# Patient Record
Sex: Male | Born: 1960 | Race: White | Hispanic: No | Marital: Married | Smoking: Former smoker
Health system: Southern US, Community
[De-identification: ages and names within clinical notes are randomized; demographics above are authoritative.]

## PROBLEM LIST (undated history)

## (undated) DIAGNOSIS — J301 Allergic rhinitis due to pollen: Secondary | ICD-10-CM

## (undated) DIAGNOSIS — K5790 Diverticulosis of intestine, part unspecified, without perforation or abscess without bleeding: Secondary | ICD-10-CM

## (undated) DIAGNOSIS — K219 Gastro-esophageal reflux disease without esophagitis: Secondary | ICD-10-CM

## (undated) DIAGNOSIS — T7840XA Allergy, unspecified, initial encounter: Secondary | ICD-10-CM

## (undated) DIAGNOSIS — E785 Hyperlipidemia, unspecified: Secondary | ICD-10-CM

## (undated) DIAGNOSIS — E119 Type 2 diabetes mellitus without complications: Secondary | ICD-10-CM

## (undated) DIAGNOSIS — I10 Essential (primary) hypertension: Secondary | ICD-10-CM

## (undated) HISTORY — PX: FRACTURE SURGERY: SHX138

## (undated) HISTORY — DX: Allergy, unspecified, initial encounter: T78.40XA

## (undated) HISTORY — DX: Gastro-esophageal reflux disease without esophagitis: K21.9

## (undated) HISTORY — PX: SCAPHOID FRACTURE SURGERY: SHX769

## (undated) HISTORY — PX: COLONOSCOPY: SHX174

## (undated) HISTORY — DX: Allergic rhinitis due to pollen: J30.1

## (undated) HISTORY — DX: Essential (primary) hypertension: I10

## (undated) HISTORY — DX: Diverticulosis of intestine, part unspecified, without perforation or abscess without bleeding: K57.90

## (undated) HISTORY — PX: KNEE SURGERY: SHX244

## (undated) HISTORY — DX: Type 2 diabetes mellitus without complications: E11.9

## (undated) HISTORY — DX: Hyperlipidemia, unspecified: E78.5

---

## 2001-03-08 HISTORY — PX: KNEE SURGERY: SHX244

## 2003-08-12 ENCOUNTER — Encounter: Admission: RE | Admit: 2003-08-12 | Discharge: 2003-08-12 | Payer: Self-pay | Admitting: Family Medicine

## 2006-03-08 HISTORY — PX: SCAPHOID FRACTURE SURGERY: SHX769

## 2012-11-01 ENCOUNTER — Encounter: Payer: Self-pay | Admitting: Family Medicine

## 2012-11-01 ENCOUNTER — Encounter: Payer: Self-pay | Admitting: Internal Medicine

## 2012-11-01 ENCOUNTER — Ambulatory Visit (INDEPENDENT_AMBULATORY_CARE_PROVIDER_SITE_OTHER): Payer: Medicare HMO | Admitting: Family Medicine

## 2012-11-01 VITALS — BP 112/80 | HR 73 | Temp 98.2°F | Ht 68.5 in | Wt 214.2 lb

## 2012-11-01 DIAGNOSIS — E785 Hyperlipidemia, unspecified: Secondary | ICD-10-CM

## 2012-11-01 DIAGNOSIS — Z125 Encounter for screening for malignant neoplasm of prostate: Secondary | ICD-10-CM

## 2012-11-01 DIAGNOSIS — Z0000 Encounter for general adult medical examination without abnormal findings: Secondary | ICD-10-CM

## 2012-11-01 DIAGNOSIS — Z1211 Encounter for screening for malignant neoplasm of colon: Secondary | ICD-10-CM

## 2012-11-01 DIAGNOSIS — Z79899 Other long term (current) drug therapy: Secondary | ICD-10-CM

## 2012-11-01 LAB — CBC WITH DIFFERENTIAL/PLATELET
Basophils Absolute: 0 10*3/uL (ref 0.0–0.1)
Basophils Relative: 0.4 % (ref 0.0–3.0)
Eosinophils Absolute: 0.2 10*3/uL (ref 0.0–0.7)
Eosinophils Relative: 1.5 % (ref 0.0–5.0)
Hemoglobin: 14 g/dL (ref 13.0–17.0)
Lymphs Abs: 1.9 10*3/uL (ref 0.7–4.0)
Monocytes Absolute: 0.6 10*3/uL (ref 0.1–1.0)
Monocytes Relative: 6.5 % (ref 3.0–12.0)
Neutro Abs: 7.2 10*3/uL (ref 1.4–7.7)
RBC: 4.72 Mil/uL (ref 4.22–5.81)
WBC: 10 10*3/uL (ref 4.5–10.5)

## 2012-11-01 NOTE — Progress Notes (Signed)
[facility] at [facility] [address] [place] [address] Phone: [phone] Fax: [phone]  Date:  11/01/2012   Name:  Willie Johnson   DOB:  1960-08-22   MRN:  [id number] Gender: male Age: 52 y.o.  Primary Physician:  Hannah Beat, MD  Evaluating MD: Hannah Beat, MD   Chief Complaint: New Patient   History of Present Illness:  Willie Johnson is a 52 y.o. pleasant patient who presents with the following:  New patient:  Colonoscopy: needs  Preventative Health Maintenance Visit:  Health Maintenance Summary Reviewed and updated, unless pt declines services.  Tobacco History Reviewed. Alcohol: No concerns, no excessive use Exercise Habits: Some activity, rec at least 30 mins 5 times a week - some now STD concerns: no risk or activity to increase risk Drug Use: None Encouraged self-testicular check  No health maintenance topics applied.  Labs reviewed with the patient.  No results found for this or any previous visit.   There are no active problems to display for this patient.   Past Medical History  Diagnosis Date  . Allergic rhinitis due to pollen   . Hyperlipidemia     Past Surgical History  Procedure Laterality Date  . Fracture surgery      History   Social History  . Marital Status: Single    Spouse Name: N/A    Number of Children: N/A  . Years of Education: N/A   Occupational History  . software    Social History Main Topics  . Smoking status: Former Smoker -- 20 years    Quit date: 04/08/2012  . Smokeless tobacco: Never Used  . Alcohol Use: No  . Drug Use: No  . Sexual Activity: Yes    Partners: Male   Other Topics Concern  . 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 Arthritis Mother   . Hyperlipidemia Mother   . Heart disease Mother   .  Hypertension Mother   . Diabetes Mother   . Hyperlipidemia Father   . Heart disease Father   . Stroke Father   . Pulmonary embolism Sister     No Known Allergies  No current outpatient prescriptions on file prior to visit.   No current facility-administered medications on file prior to visit.     Review of Systems:   General: Denies fever, chills, sweats. No significant weight loss. Eyes: Denies blurring,significant itching ENT: Denies earache, sore throat, and hoarseness. Cardiovascular: Denies chest pains, palpitations, dyspnea on exertion Respiratory: Denies cough, dyspnea at rest,wheeezing Breast: no concerns about lumps GI: Denies nausea, vomiting, diarrhea, constipation, change in bowel habits, abdominal pain, melena, hematochezia GU: Denies penile discharge, ED, urinary flow / outflow problems. No STD concerns. Musculoskeletal: Denies back pain, joint pain Derm: Denies rash, itching Neuro: Denies  paresthesias, frequent falls, frequent headaches Psych: Denies depression, anxiety Endocrine: Denies cold intolerance, heat intolerance, polydipsia Heme: Denies enlarged lymph nodes Allergy: No hayfever   Physical Examination: BP 112/80  Pulse 73  Temp(Src) 98.2 F (36.8 C) (Oral)  Ht 5' 8.5" (1.74 m)  Wt 214 lb 4 oz (97.183 kg)  BMI 32.1 kg/m2  Ideal Body Weight: Weight in (lb) to have BMI = 25: 166.5   Wt Readings from Last 3 Encounters:  11/01/12 214 lb 4  oz (97.183 kg)    GEN: well developed, well nourished, no acute distress Eyes: conjunctiva and lids normal, PERRLA, EOMI ENT: TM clear, nares clear, oral exam WNL Neck: supple, no lymphadenopathy, no thyromegaly, no JVD Pulm: clear to auscultation and percussion, respiratory effort normal CV: regular rate and rhythm, S1-S2, no murmur, rub or gallop, no bruits, peripheral pulses normal and symmetric, no cyanosis, clubbing, edema or varicosities Chest: no scars, masses GI: soft, non-tender; no  hepatosplenomegaly, masses; active bowel sounds all quadrants GU: no hernia, testicular mass, penile discharge, or prostate enlargement Lymph: no cervical, axillary or inguinal adenopathy MSK: gait normal, muscle tone and strength WNL, no joint swelling, effusions, discoloration, crepitus  SKIN: clear, good turgor, color WNL, no rashes, lesions, or ulcerations Neuro: normal mental status, normal strength, sensation, and motion Psych: alert; oriented to person, place and time, normally interactive and not anxious or depressed in appearance.  Assessment and Plan:  Routine general medical examination at a health care facility  Special screening for malignant neoplasm of prostate - Plan: PSA  Other and unspecified hyperlipidemia - Plan: Lipid panel  Encounter for long-term (current) use of other medications - Plan: Basic metabolic panel, CBC with Differential, Hepatic function panel  Special screening for malignant neoplasms, colon - Plan: Ambulatory referral to Gastroenterology  The patient's preventative maintenance and recommended screening tests for an annual wellness exam were reviewed in full today. Brought up to date unless services declined.  Counselled on the importance of diet, exercise, and its role in overall health and mortality. The patient's FH and SH was reviewed, including their home life, tobacco status, and drug and alcohol status.   Healthy new patient.  Keep up lipitor  Check labs colonoscopy  Orders Today:  Orders Placed This Encounter  Procedures  . Basic metabolic panel  . CBC with Differential  . Hepatic function panel  . Lipid panel  . PSA  . Ambulatory referral to Gastroenterology    Referral Priority:  Routine    Referral Type:  Consultation    Referral Reason:  Specialty Services Required    Requested Specialty:  Gastroenterology    Number of Visits Requested:  1    Updated Medication List: (Includes new medications, updates to list, dose  adjustments) Meds ordered this encounter  Medications  . atorvastatin (LIPITOR) 40 MG tablet    Sig: Take 40 mg by mouth daily.  Marland Kitchen aspirin 81 MG tablet    Sig: Take 81 mg by mouth daily.  . Multiple Vitamin (MULTIVITAMIN) tablet    Sig: Take 1 tablet by mouth daily.    Medications Discontinued: There are no discontinued medications.    Signed, Elpidio Galea. Dontrell Stuck, MD 11/01/2012 2:12 PM

## 2012-11-02 ENCOUNTER — Encounter: Payer: Self-pay | Admitting: Family Medicine

## 2012-11-02 LAB — LIPID PANEL
HDL: 36.1 mg/dL — ABNORMAL LOW (ref >39.00)
VLDL: 44 mg/dL — ABNORMAL HIGH (ref 0.0–40.0)

## 2012-11-02 LAB — HEPATIC FUNCTION PANEL
AST: 23 U/L (ref 0–37)
Albumin: 4.1 g/dL (ref 3.5–5.2)
Total Protein: 6.9 g/dL (ref 6.0–8.3)

## 2012-11-02 LAB — BASIC METABOLIC PANEL
BUN: 9 mg/dL (ref 6–23)
Calcium: 9.1 mg/dL (ref 8.4–10.5)
Chloride: 105 mEq/L (ref 96–112)
Creatinine, Ser: 0.9 mg/dL (ref 0.4–1.5)
Potassium: 3.8 mEq/L (ref 3.5–5.1)
Sodium: 139 mEq/L (ref 135–145)

## 2012-11-02 LAB — PSA: PSA: 0.75 ng/mL (ref 0.10–4.00)

## 2012-12-18 ENCOUNTER — Ambulatory Visit (AMBULATORY_SURGERY_CENTER): Payer: Managed Care, Other (non HMO)

## 2012-12-18 VITALS — Ht 69.0 in | Wt 205.0 lb

## 2012-12-18 DIAGNOSIS — Z1211 Encounter for screening for malignant neoplasm of colon: Secondary | ICD-10-CM

## 2012-12-18 MED ORDER — MOVIPREP 100 G PO SOLR: 1.0000 | Freq: Once | ORAL | Status: DC

## 2012-12-19 ENCOUNTER — Encounter: Payer: Self-pay | Admitting: Internal Medicine

## 2013-01-01 ENCOUNTER — Encounter: Payer: Medicare HMO | Admitting: Internal Medicine

## 2013-01-01 ENCOUNTER — Telehealth: Payer: Self-pay | Admitting: *Deleted

## 2013-01-01 NOTE — Telephone Encounter (Signed)
Dr. Rhea Belton asked if  We could call pt. Because he received a message that pt. Was having trouble with prep. Call placed to pt. And he stated that he is unable to tolerated the prep he started vomiting after drinking a small portion of the second part of the prep   and has only had a small stool since taking the prep. Informed Dr. Rhea Belton of information and he instructed me to cancel the procedure and tell the pt. To call back and reschedule an appointment so he can receive a different prep with something for nausea. Dr. Rhea Belton also told me to let pt. Know that he will not be charged. Instruction given to pt. With  number  To call and reschedule

## 2013-01-11 ENCOUNTER — Other Ambulatory Visit: Payer: Self-pay

## 2013-04-02 ENCOUNTER — Encounter: Payer: Self-pay | Admitting: Internal Medicine

## 2013-04-30 ENCOUNTER — Encounter: Payer: Self-pay | Admitting: Internal Medicine

## 2013-04-30 ENCOUNTER — Ambulatory Visit (INDEPENDENT_AMBULATORY_CARE_PROVIDER_SITE_OTHER): Payer: Medicare HMO | Admitting: Internal Medicine

## 2013-04-30 VITALS — BP 132/88 | HR 76 | Ht 68.5 in | Wt 216.8 lb

## 2013-04-30 DIAGNOSIS — Z1211 Encounter for screening for malignant neoplasm of colon: Secondary | ICD-10-CM

## 2013-04-30 MED ORDER — METOCLOPRAMIDE HCL 10 MG PO TABS: 10.0000 mg | ORAL_TABLET | Freq: Four times a day (QID) | ORAL | Status: DC

## 2013-04-30 MED ORDER — SOD PICOSULFATE-MAG OX-CIT ACD 10-3.5-12 MG-GM-GM PO PACK: 1.0000 | PACK | Freq: Once | ORAL | Status: DC

## 2013-04-30 NOTE — Patient Instructions (Signed)

## 2013-04-30 NOTE — Progress Notes (Signed)
Patient ID: Willie Johnson, male   DOB: 1960/10/04, 53 y.o.   MRN: [id number] HPI: Coran Dipaola is a 53 yo male with PMH of HL who is here today to discuss screening colonoscopy after having vomiting and poor tolerance of MoviPrep.  He was seen in pre-visit and scheduled for direct colonoscopy but is now seen by me because of issues tolerating the prep. He reports that he is feeling well. He denies complaints. He has normal bowel habits including no blood in his stool or melena. No diarrhea or constipation. No change in bowel habits. No abdominal pain. No family history of colon cancer. He has never had a colonoscopy.  When he tried to drink his previous colon prep, he developed nausea and vomiting after one quarter of the first half of the dose.  Past Medical History  Diagnosis Date  . Allergic rhinitis due to pollen   . Hyperlipidemia     Past Surgical History  Procedure Laterality Date  . Fracture surgery      Current Outpatient Prescriptions  Medication Sig Dispense Refill  . aspirin 81 MG tablet Take 81 mg by mouth daily.      Marland Kitchen atorvastatin (LIPITOR) 40 MG tablet Take 40 mg by mouth daily.      Marland Kitchen MOVIPREP 100 G SOLR Take 1 kit (200 g total) by mouth once.  1 kit  0  . Multiple Vitamin (MULTIVITAMIN) tablet Take 1 tablet by mouth daily.      . metoCLOPramide (REGLAN) 10 MG tablet Take 1 tablet (10 mg total) by mouth 4 (four) times daily. Take 1 tablet before each cup of colon prep.  2 tablet  0  . Sod Picosulfate-Mag Ox-Cit Acd 10-3.5-12 MG-GM-GM PACK Take 1 kit by mouth once.  1 each  0   No current facility-administered medications for this visit.    No Known Allergies  Family History  Problem Relation Age of Onset  . Arthritis Mother   . Hyperlipidemia Mother   . Heart disease Mother   . Hypertension Mother   . Diabetes Mother   . Hyperlipidemia Father   . Heart disease Father   . Stroke Father   . Pulmonary embolism Sister   . Colon cancer Neg Hx   . Pancreatic  cancer Neg Hx   . Stomach cancer Neg Hx     History  Substance Use Topics  . Smoking status: Former Smoker -- 20 years    Quit date: 04/08/2012  . Smokeless tobacco: Never Used  . Alcohol Use: No    ROS: As per history of present illness, otherwise negative  BP 132/88  Pulse 76  Ht 5' 8.5" (1.74 m)  Wt 216 lb 12.8 oz (98.34 kg)  BMI 32.48 kg/m2 Constitutional: Well-developed and well-nourished. No distress. HEENT: Normocephalic and atraumatic.  No scleral icterus. Neck: Neck supple. Trachea midline. Cardiovascular: Normal rate, regular rhythm and intact distal pulses.  Pulmonary/chest: Effort normal and breath sounds normal. No wheezing, rales or rhonchi. Abdominal: Soft, nontender, nondistended. Bowel sounds active throughout. Extremities: no clubbing, cyanosis, or edema Neurological: Alert and oriented to person place and time. Skin: Skin is warm and dry. No rashes noted. Psychiatric: Normal mood and affect. Behavior is normal.  RELEVANT LABS AND IMAGING: CBC    Component Value Date/Time   WBC 10.0 11/01/2012 1440   RBC 4.72 11/01/2012 1440   HGB 14.0 11/01/2012 1440   HCT 41.0 11/01/2012 1440   PLT 275.0 11/01/2012 1440   MCV 86.9 11/01/2012 1440  MCHC 34.2 11/01/2012 1440   RDW 14.0 11/01/2012 1440   LYMPHSABS 1.9 11/01/2012 1440   MONOABS 0.6 11/01/2012 1440   EOSABS 0.2 11/01/2012 1440   BASOSABS 0.0 11/01/2012 1440    CMP     Component Value Date/Time   NA 139 11/01/2012 1440   K 3.8 11/01/2012 1440   CL 105 11/01/2012 1440   CO2 27 11/01/2012 1440   GLUCOSE 84 11/01/2012 1440   BUN 9 11/01/2012 1440   CREATININE 0.9 11/01/2012 1440   CALCIUM 9.1 11/01/2012 1440   PROT 6.9 11/01/2012 1440   ALBUMIN 4.1 11/01/2012 1440   AST 23 11/01/2012 1440   ALT 38 11/01/2012 1440   ALKPHOS 83 11/01/2012 1440   BILITOT 0.6 11/01/2012 1440    ASSESSMENT/PLAN:  53 yo male with PMH of HL who is here today to discuss screening colonoscopy after having vomiting and poor tolerance of  MoviPrep.   1. CRC screening -- he is average risk for colorectal cancer screening and is agreeable to colonoscopy. We discussed the test today including the risks and benefits and he is agreeable to proceed. He tolerated MoviPrep very poorly.  Will change to Prep-o-Pik in split fashion with metoclopromide 10 mg 30 minutes before each half of the prep.  I asked that he call me should he tolerate the first half of his next prep poorly.

## 2013-05-07 ENCOUNTER — Ambulatory Visit (AMBULATORY_SURGERY_CENTER): Payer: Medicare HMO | Admitting: Internal Medicine

## 2013-05-07 ENCOUNTER — Encounter: Payer: Self-pay | Admitting: Internal Medicine

## 2013-05-07 VITALS — BP 137/84 | HR 72 | Temp 98.1°F | Resp 22 | Ht 68.0 in | Wt 216.0 lb

## 2013-05-07 DIAGNOSIS — D126 Benign neoplasm of colon, unspecified: Secondary | ICD-10-CM

## 2013-05-07 DIAGNOSIS — Z1211 Encounter for screening for malignant neoplasm of colon: Secondary | ICD-10-CM

## 2013-05-07 MED ORDER — SODIUM CHLORIDE 0.9 % IV SOLN: 500.0000 mL | INTRAVENOUS | Status: DC

## 2013-05-07 NOTE — Progress Notes (Signed)
Procedure ends, to recovery, report given and VSS. 

## 2013-05-07 NOTE — Progress Notes (Signed)
Called to room to assist during endoscopic procedure.  Patient ID and intended procedure confirmed with present staff. Received instructions for my participation in the procedure from the performing physician.  

## 2013-05-07 NOTE — Op Note (Signed)
[place]  [address] [place], [address]   COLONOSCOPY PROCEDURE REPORT  PATIENT: Willie Johnson, Willie Johnson  MR#: [id number] BIRTHDATE: Jun 03, 1960 , 53  yrs. old GENDER: Male ENDOSCOPIST: Jerene Bears, MD REFERRED OV:FIEPPIR Celedonio Savage, M.D. PROCEDURE DATE:  05/07/2013 PROCEDURE:   Colonoscopy with snare polypectomy First Screening Colonoscopy - Avg.  risk and is 50 yrs.  old or older Yes.  Prior Negative Screening - Now for repeat screening. N/A  History of Adenoma - Now for follow-up colonoscopy & has been > or = to 3 yrs.  N/A  Polyps Removed Today? Yes. ASA CLASS:   Class II INDICATIONS:average risk screening and first colonoscopy. MEDICATIONS: MAC sedation, administered by CRNA and propofol (Diprivan) 300mg  IV  DESCRIPTION OF PROCEDURE:   After the risks benefits and alternatives of the procedure were thoroughly explained, informed consent was obtained.  A digital rectal exam revealed no rectal mass.   The LB [id number]  endoscope was introduced through the anus and advanced to the cecum, which was identified by both the appendix and ileocecal valve. No adverse events experienced. The quality of the prep was Prepopik good  The instrument was then slowly withdrawn as the colon was fully examined.   COLON FINDINGS: Mild diverticulosis was noted in the ascending colon, at the hepatic flexure, in the descending colon, and sigmoid colon.   A flat polyp measuring 5 mm in size was found in the ascending colon.  A polypectomy was performed with a cold snare. The resection was complete and the polyp tissue was completely retrieved.  Retroflexed views revealed small internal hemorrhoids and small skin tag. The time to cecum=4 minutes 19 seconds. Withdrawal time=11 minutes 33 seconds.  The scope was withdrawn and the procedure completed. COMPLICATIONS: There were no complications.  ENDOSCOPIC IMPRESSION: 1.   Mild diverticulosis was noted in the ascending  colon, at the hepatic flexure, in the descending colon, and sigmoid colon 2.   Flat polyp measuring 5 mm in size was found in the ascending colon; polypectomy was performed with a cold snare  RECOMMENDATIONS: 1.  High fiber diet 2.  If the polyp removed today is proven to be an adenomatous (pre-cancerous) polyp, you will need a repeat colonoscopy in 5 years.  Otherwise you should continue to follow colorectal cancer screening guidelines for "routine risk" patients with colonoscopy in 10 years.  You will receive a letter within 1-2 weeks with the results of your biopsy as well as final recommendations.  Please call my office if you have not received a letter after 3 weeks.   eSigned:  Jerene Bears, MD 05/07/2013 3:29 PM  cc: The Patient and Kathryne Eriksson, MD

## 2013-05-07 NOTE — Patient Instructions (Signed)
YOU HAD AN ENDOSCOPIC PROCEDURE TODAY AT THE [facility]: Refer to the procedure report that was given to you for any specific questions about what was found during the examination.  If the procedure report does not answer your questions, please call your gastroenterologist to clarify.  If you requested that your care partner not be given the details of your procedure findings, then the procedure report has been included in a sealed envelope for you to review at your convenience later.  YOU SHOULD EXPECT: Some feelings of bloating in the abdomen. Passage of more gas than usual.  Walking can help get rid of the air that was put into your GI tract during the procedure and reduce the bloating. If you had a lower endoscopy (such as a colonoscopy or flexible sigmoidoscopy) you may notice spotting of blood in your stool or on the toilet paper. If you underwent a bowel prep for your procedure, then you may not have a normal bowel movement for a few days.  DIET: Your first meal following the procedure should be a light meal and then it is ok to progress to your normal diet.  A half-sandwich or bowl of soup is an example of a good first meal.  Heavy or fried foods are harder to digest and may make you feel nauseous or bloated.  Likewise meals heavy in dairy and vegetables can cause extra gas to form and this can also increase the bloating.  Drink plenty of fluids but you should avoid alcoholic beverages for 24 hours.  ACTIVITY: Your care partner should take you home directly after the procedure.  You should plan to take it easy, moving slowly for the rest of the day.  You can resume normal activity the day after the procedure however you should NOT DRIVE or use heavy machinery for 24 hours (because of the sedation medicines used during the test).    SYMPTOMS TO REPORT IMMEDIATELY: A gastroenterologist can be reached at any hour.  During normal business hours, 8:30 AM to 5:00 PM Monday through Friday,  call [phone].  After hours and on weekends, please call the GI answering service at [phone] who will take a message and have the physician on call contact you.   Following lower endoscopy (colonoscopy or flexible sigmoidoscopy):  Excessive amounts of blood in the stool  Significant tenderness or worsening of abdominal pains  Swelling of the abdomen that is new, acute  Fever of 100F or higher  FOLLOW UP: If any biopsies were taken you will be contacted by phone or by letter within the next 1-3 weeks.  Call your gastroenterologist if you have not heard about the biopsies in 3 weeks.  Our staff will call the home number listed on your records the next business day following your procedure to check on you and address any questions or concerns that you may have at that time regarding the information given to you following your procedure. This is a courtesy call and so if there is no answer at the home number and we have not heard from you through the emergency physician on call, we will assume that you have returned to your regular daily activities without incident.  SIGNATURES/CONFIDENTIALITY: You and/or your care partner have signed paperwork which will be entered into your electronic medical record.  These signatures attest to the fact that that the information above on your After Visit Summary has been reviewed and is understood.  Full responsibility of the confidentiality of this   discharge information lies with you and/or your care-partner.  Please continue your normal medications  Please read over handouts about polyps, diverticulosis and high fiber diets  Await pathology 

## 2013-05-08 ENCOUNTER — Telehealth: Payer: Self-pay

## 2013-05-08 NOTE — Telephone Encounter (Signed)
Left message on answering machine. 

## 2013-05-14 ENCOUNTER — Encounter: Payer: Self-pay | Admitting: Internal Medicine

## 2013-08-31 ENCOUNTER — Encounter: Payer: Self-pay | Admitting: Family Medicine

## 2013-08-31 ENCOUNTER — Ambulatory Visit (INDEPENDENT_AMBULATORY_CARE_PROVIDER_SITE_OTHER): Payer: Managed Care, Other (non HMO) | Admitting: Family Medicine

## 2013-08-31 VITALS — BP 110/80 | HR 64 | Temp 98.0°F | Ht 68.5 in | Wt 194.2 lb

## 2013-08-31 DIAGNOSIS — A09 Infectious gastroenteritis and colitis, unspecified: Secondary | ICD-10-CM

## 2013-08-31 MED ORDER — CIPROFLOXACIN HCL 500 MG PO TABS: 500.0000 mg | ORAL_TABLET | Freq: Two times a day (BID) | ORAL | Status: DC

## 2013-08-31 NOTE — Assessment & Plan Note (Signed)
Typical, no red flags.  Treat with 3 days of cipro.  Hydrate.

## 2013-08-31 NOTE — Patient Instructions (Signed)
Push fluids, rest.  Take 1 tab twice daily of cipro for 3 days.  Call if not improving after antibiotics as expected.  Go to ER if severe abdominal pain.

## 2013-08-31 NOTE — Progress Notes (Signed)
Pre visit review using our clinic review tool, if applicable. No additional management support is needed unless otherwise documented below in the visit note. 

## 2013-08-31 NOTE — Progress Notes (Signed)
   Subjective:    Patient ID: Willie Johnson, male    DOB: 1960/04/03, 53 y.o.   MRN: [id number]  Diarrhea  Associated symptoms include abdominal pain and a fever.  Fever  Associated symptoms include abdominal pain and diarrhea. Pertinent negatives include no chest pain or ear pain.    53 year old male pt fo Dr. Lillie Fragmin with recent trip to [place] presents with new onset diarrhea  In last week.  Had fever initially, now resolved. Diarrhea loose stools 10 x a day. Some nausea and decreased appetite, no vomiting.He is drinking a lot of fluids.  No blood in stool.  He was in [facility] friend also sick.     Review of Systems  Constitutional: Positive for fever and fatigue.  HENT: Negative for ear pain.   Eyes: Negative for pain.  Respiratory: Negative for shortness of breath.   Cardiovascular: Negative for chest pain, palpitations and leg swelling.  Gastrointestinal: Positive for abdominal pain and diarrhea. Negative for blood in stool and abdominal distention.       Pain in low abdomen prior to diarrhea then feels better after.       Objective:   Physical Exam  Constitutional: Vital signs are normal. He appears well-developed and well-nourished.  HENT:  Head: Normocephalic.  Right Ear: Hearing normal.  Left Ear: Hearing normal.  Nose: Nose normal.  Mouth/Throat: Oropharynx is clear and moist and mucous membranes are normal.  Neck: Trachea normal. Carotid bruit is not present. No mass and no thyromegaly present.  Cardiovascular: Normal rate, regular rhythm and normal pulses.  Exam reveals no gallop, no distant heart sounds and no friction rub.   No murmur heard. No peripheral edema  Pulmonary/Chest: Effort normal and breath sounds normal. No respiratory distress.  Skin: Skin is warm, dry and intact. No rash noted.  Psychiatric: He has a normal mood and affect. His speech is normal and behavior is normal. Thought content normal.          Assessment &  Plan:

## 2013-10-02 ENCOUNTER — Encounter: Payer: Self-pay | Admitting: Family Medicine

## 2013-10-02 ENCOUNTER — Ambulatory Visit (INDEPENDENT_AMBULATORY_CARE_PROVIDER_SITE_OTHER): Payer: Managed Care, Other (non HMO) | Admitting: Family Medicine

## 2013-10-02 VITALS — BP 112/72 | HR 63 | Temp 98.6°F | Ht 68.5 in | Wt 192.2 lb

## 2013-10-02 DIAGNOSIS — J301 Allergic rhinitis due to pollen: Secondary | ICD-10-CM | POA: Insufficient documentation

## 2013-10-02 DIAGNOSIS — B081 Molluscum contagiosum: Secondary | ICD-10-CM

## 2013-10-02 DIAGNOSIS — E785 Hyperlipidemia, unspecified: Secondary | ICD-10-CM | POA: Insufficient documentation

## 2013-10-02 NOTE — Progress Notes (Signed)
   [place] [address] Phone: [phone] Fax: [phone]  Patient ID: Willie Johnson MRN: [id number], DOB: 01/31/1961, 53 y.o. Date of Encounter: 10/02/2013  Primary Physician:  Owens Loffler, MD   Chief Complaint: Rash   Subjective:   History of Present Illness:  Willie Johnson is a 53 y.o. very pleasant male patient who presents with the following:  R rash, LE. For about 2 months, the patient has had a rash that is mildly vesicular in nature without any specific pain and no itching on his lower lateral right leg.  Past Medical History, Surgical History, Social History, Family History, Problem List, Medications, and Allergies have been reviewed and updated if relevant.  Review of Systems:  GEN: No acute illnesses, no fevers, chills. GI: No n/v/d, eating normally Pulm: No SOB Interactive and getting along well at home.  Otherwise, ROS is as per the HPI.  Objective:   Physical Examination: BP 112/72  Pulse 63  Temp(Src) 98.6 F (37 C) (Oral)  Ht 5' 8.5" (1.74 m)  Wt 192 lb 4 oz (87.204 kg)  BMI 28.80 kg/m2   GEN: WDWN, NAD, Non-toxic, A & O x 3 HEENT: Atraumatic, Normocephalic. Neck supple. No masses, No LAD. Ears and Nose: No external deformity. CV: RRR, No M/G/R. No JVD. No thrill. No extra heart sounds. PULM: CTA B, no wheezes, crackles, rhonchi. No retractions. No resp. distress. No accessory muscle use. EXTR: No c/c/e NEURO Normal gait.  PSYCH: Normally interactive. Conversant. Not depressed or anxious appearing.  Calm demeanor.    approximate 2 cm across with varying degrees of confluence with some small centralized pitting approximately 2 cm across in total and one smaller lesion about a centimeters caudal to this.  Laboratory and Imaging Data:  Assessment & Plan:   Molluscum contagiosum  For now, I would only observe. If it is very persistent over time and worsens, then cryotherapy could be considered.  New Prescriptions     No medications on file   Modified Medications   No medications on file   No orders of the defined types were placed in this encounter.   Follow-up: No Follow-up on file. Unless noted above, the patient is to follow-up if symptoms worsen. Red flags were reviewed with the patient.  Signed,  Maud Deed. Mannat Benedetti, MD, CAQ Sports Medicine   Discontinued Medications   CIPROFLOXACIN (CIPRO) 500 MG TABLET    Take 1 tablet (500 mg total) by mouth 2 (two) times daily.   Current Medications at Discharge:   Medication List       This list is accurate as of: 10/02/13  1:54 PM.  Always use your most recent med list.               aspirin 81 MG tablet  Take 81 mg by mouth daily.     atorvastatin 40 MG tablet  Commonly known as:  LIPITOR  Take 40 mg by mouth daily.     multivitamin tablet  Take 1 tablet by mouth daily.

## 2013-10-02 NOTE — Progress Notes (Signed)
Pre visit review using our clinic review tool, if applicable. No additional management support is needed unless otherwise documented below in the visit note. 

## 2013-11-07 ENCOUNTER — Telehealth: Payer: Self-pay | Admitting: Family Medicine

## 2013-11-07 NOTE — Telephone Encounter (Signed)
Patient called back scheduled appointment for 12/11/13 pt aware of appointment date and time

## 2013-11-07 NOTE — Telephone Encounter (Signed)
Yes, not Monday, sometime in oct

## 2013-11-07 NOTE — Telephone Encounter (Signed)
Patient called to schedule appointment for physical for work to get insurance.  Patient thought he had already made his physical appointment, but he didn't.  Patient has to have the physical by 01/05/14 and your first available is in December.  Can patient come in sooner for physical?

## 2013-11-07 NOTE — Telephone Encounter (Signed)
ok 

## 2013-12-07 ENCOUNTER — Other Ambulatory Visit (INDEPENDENT_AMBULATORY_CARE_PROVIDER_SITE_OTHER): Payer: Managed Care, Other (non HMO)

## 2013-12-07 ENCOUNTER — Encounter: Payer: Self-pay | Admitting: Family Medicine

## 2013-12-07 ENCOUNTER — Ambulatory Visit (INDEPENDENT_AMBULATORY_CARE_PROVIDER_SITE_OTHER): Payer: Managed Care, Other (non HMO) | Admitting: Family Medicine

## 2013-12-07 VITALS — BP 110/70 | HR 62 | Temp 98.2°F | Ht 68.5 in | Wt 194.5 lb

## 2013-12-07 DIAGNOSIS — Z125 Encounter for screening for malignant neoplasm of prostate: Secondary | ICD-10-CM

## 2013-12-07 DIAGNOSIS — S6000XA Contusion of unspecified finger without damage to nail, initial encounter: Secondary | ICD-10-CM

## 2013-12-07 DIAGNOSIS — Z0000 Encounter for general adult medical examination without abnormal findings: Secondary | ICD-10-CM

## 2013-12-07 DIAGNOSIS — S6010XA Contusion of unspecified finger with damage to nail, initial encounter: Secondary | ICD-10-CM

## 2013-12-07 DIAGNOSIS — E785 Hyperlipidemia, unspecified: Secondary | ICD-10-CM

## 2013-12-07 LAB — BASIC METABOLIC PANEL
BUN: 14 mg/dL (ref 6–23)
CALCIUM: 9.1 mg/dL (ref 8.4–10.5)
CO2: 27 meq/L (ref 19–32)
Chloride: 106 mEq/L (ref 96–112)
Creatinine, Ser: 1 mg/dL (ref 0.4–1.5)
GFR: 86.48 mL/min (ref >60.00)
Glucose, Bld: 96 mg/dL (ref 70–99)
Potassium: 4.1 mEq/L (ref 3.5–5.1)
SODIUM: 139 meq/L (ref 135–145)

## 2013-12-07 LAB — LIPID PANEL
CHOL/HDL RATIO: 4
Cholesterol: 151 mg/dL (ref 0–200)
HDL: 33.7 mg/dL — AB (ref >39.00)
LDL Cholesterol: 97 mg/dL (ref 0–99)
NONHDL: 117.3
Triglycerides: 101 mg/dL (ref 0.0–149.0)
VLDL: 20.2 mg/dL (ref 0.0–40.0)

## 2013-12-07 LAB — CBC WITH DIFFERENTIAL/PLATELET
BASOS ABS: 0 10*3/uL (ref 0.0–0.1)
Basophils Relative: 0.4 % (ref 0.0–3.0)
EOS ABS: 0.2 10*3/uL (ref 0.0–0.7)
EOS PCT: 2 % (ref 0.0–5.0)
HCT: 43.1 % (ref 39.0–52.0)
Hemoglobin: 14.6 g/dL (ref 13.0–17.0)
LYMPHS ABS: 2.1 10*3/uL (ref 0.7–4.0)
Lymphocytes Relative: 27 % (ref 12.0–46.0)
MCHC: 33.8 g/dL (ref 30.0–36.0)
MCV: 88.3 fl (ref 78.0–100.0)
MONO ABS: 0.5 10*3/uL (ref 0.1–1.0)
Monocytes Relative: 6.8 % (ref 3.0–12.0)
Neutro Abs: 5 10*3/uL (ref 1.4–7.7)
Neutrophils Relative %: 63.8 % (ref 43.0–77.0)
Platelets: 253 10*3/uL (ref 150.0–400.0)
RBC: 4.88 Mil/uL (ref 4.22–5.81)
RDW: 13.5 % (ref 11.5–15.5)
WBC: 7.8 10*3/uL (ref 4.0–10.5)

## 2013-12-07 LAB — HEPATIC FUNCTION PANEL
ALBUMIN: 3.9 g/dL (ref 3.5–5.2)
ALK PHOS: 87 U/L (ref 39–117)
ALT: 19 U/L (ref 0–53)
AST: 18 U/L (ref 0–37)
BILIRUBIN DIRECT: 0.1 mg/dL (ref 0.0–0.3)
TOTAL PROTEIN: 6.8 g/dL (ref 6.0–8.3)
Total Bilirubin: 1 mg/dL (ref 0.2–1.2)

## 2013-12-07 LAB — TSH: TSH: 1.31 u[IU]/mL (ref 0.35–4.50)

## 2013-12-07 LAB — PSA: PSA: 0.58 ng/mL (ref 0.10–4.00)

## 2013-12-07 NOTE — Progress Notes (Signed)
   Subjective:    Patient ID: Willie Johnson, male    DOB: 08/21/1960, 53 y.o.   MRN: [id number]  HPI  53 year old male patient of Dr. Lillie Fragmin presents after shutting his finger in a car door. Occurred 2 days ago.  Hit  Right 3rd distatl digit. Nail bed has blood and there is an area of contusion. Finger tip is swollen. Pain  With palpation and movement. He can bend finger today, was unable to yesterday.  No fever, no redness spreading.     Review of Systems  Constitutional: Negative for fever and fatigue.  HENT: Negative for ear pain.   Eyes: Negative for pain.  Respiratory: Negative for shortness of breath.   Cardiovascular: Negative for chest pain.       Objective:   Physical Exam  Constitutional: He appears well-developed and well-nourished.  Neck: Normal range of motion. Neck supple.  Cardiovascular: Normal rate.   Pulmonary/Chest: Effort normal.  Musculoskeletal:       Right hand: He exhibits tenderness. He exhibits normal range of motion, no bony tenderness and normal capillary refill. Normal sensation noted.  Swelling on distal 3rd finger, contusion at basel of nail with slight erythema, no increase warmth, small subungual hematoma.  Pt with full range of motion at joint.  nml sensation at finger tip          Assessment & Plan:

## 2013-12-07 NOTE — Assessment & Plan Note (Signed)
Apply antibiotic ointment to open area and cover.

## 2013-12-07 NOTE — Assessment & Plan Note (Signed)
No severe pain, > 48 hours from injury, no need for drainage of hematoma

## 2013-12-07 NOTE — Progress Notes (Signed)
Pre visit review using our clinic review tool, if applicable. No additional management support is needed unless otherwise documented below in the visit note. 

## 2013-12-07 NOTE — Patient Instructions (Signed)
Call if pain increasing or redness spreading. Keep small cut covered by bandaid and antibiotics ointment.

## 2013-12-12 ENCOUNTER — Encounter: Payer: Self-pay | Admitting: Family Medicine

## 2013-12-12 ENCOUNTER — Ambulatory Visit (INDEPENDENT_AMBULATORY_CARE_PROVIDER_SITE_OTHER): Payer: Managed Care, Other (non HMO) | Admitting: Family Medicine

## 2013-12-12 VITALS — BP 110/80 | HR 63 | Temp 98.3°F | Ht 68.75 in | Wt 192.5 lb

## 2013-12-12 DIAGNOSIS — Z0000 Encounter for general adult medical examination without abnormal findings: Secondary | ICD-10-CM

## 2013-12-12 DIAGNOSIS — Z23 Encounter for immunization: Secondary | ICD-10-CM

## 2013-12-12 NOTE — Progress Notes (Signed)
Pre visit review using our clinic review tool, if applicable. No additional management support is needed unless otherwise documented below in the visit note. 

## 2013-12-12 NOTE — Progress Notes (Signed)
Dr. Frederico Hamman T. Kenzly Rogoff, MD, [facility] [place], [address] Phone: [phone] Fax: [phone]  12/12/2013  Patient: Willie Johnson, MRN: [id number], DOB: December 24, 1960, 53 y.o.  Primary Physician:  Owens Loffler, MD  Chief Complaint: Annual Exam  Subjective:   Willie Johnson is a 53 y.o. pleasant patient who presents with the following:  Preventative Health Maintenance Visit:  Health Maintenance Summary Reviewed and updated, unless pt declines services.  Tobacco History Reviewed. Started back smoking again 1/2 PPD Alcohol: No concerns, no excessive use Exercise Habits: Some activity, rec at least 30 mins 5 times a week - walking alot STD concerns: no risk or activity to increase risk Drug Use: None Encouraged self-testicular check  Lost 25 pounds Walking every day. Everything is going well at home.  Health Maintenance  Topic Date Due  . Influenza Vaccine  10/07/2014  . Colonoscopy  05/08/2023  . Tetanus/tdap  12/13/2023   Immunization History  Administered Date(s) Administered  . Influenza-Unspecified 11/29/2013  . Tdap 12/12/2013   Patient Active Problem List   Diagnosis Date Noted  . Hyperlipidemia   . Allergic rhinitis due to pollen    Past Medical History  Diagnosis Date  . Allergic rhinitis due to pollen   . Hyperlipidemia    Past Surgical History  Procedure Laterality Date  . Fracture surgery     History   Social History  . Marital Status: Single    Spouse Name: N/A    Number of Children: N/A  . Years of Education: N/A   Occupational History  . software    Social History Main Topics  . Smoking status: Former Smoker -- 20 years    Quit date: 04/08/2012  . Smokeless tobacco: Never Used  . Alcohol Use: No  . Drug Use: No  . Sexual Activity: Yes    Partners: Male   Other Topics Concern  . 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 Arthritis Mother   . Hyperlipidemia Mother   . Heart disease Mother   . Hypertension Mother   . Diabetes Mother   . Hyperlipidemia Father   . Heart disease Father   . Stroke Father   . Pulmonary embolism Sister   . Colon cancer Neg Hx   . Pancreatic cancer Neg Hx   . Stomach cancer Neg Hx    No Known Allergies  Medication list has been reviewed and updated.   General: Denies fever, chills, sweats. No significant weight loss. Eyes: Denies blurring,significant itching ENT: Denies earache, sore throat, and hoarseness. Cardiovascular: Denies chest pains, palpitations, dyspnea on exertion Respiratory: Denies cough, dyspnea at rest,wheeezing Breast: no concerns about lumps GI: Denies nausea, vomiting, diarrhea, constipation, change in bowel habits, abdominal pain, melena, hematochezia GU: Denies penile discharge, ED, urinary flow / outflow problems. No STD concerns. Musculoskeletal: Denies back pain, joint pain Derm: Denies rash, itching Neuro: Denies  paresthesias, frequent falls, frequent headaches Psych: Denies depression, anxiety Endocrine: Denies cold intolerance, heat intolerance, polydipsia Heme: Denies enlarged lymph nodes Allergy: No hayfever  Objective:   BP 110/80  Pulse 63  Temp(Src) 98.3 F (36.8 C) (Oral)  Ht 5' 8.75" (1.746 m)  Wt 192 lb 8 oz (87.317 kg)  BMI 28.64 kg/m2  No exam  data present  GEN: well developed, well nourished, no acute distress Eyes: conjunctiva and lids normal, PERRLA, EOMI ENT: TM clear, nares clear, oral exam WNL Neck: supple, no lymphadenopathy, no thyromegaly, no JVD Pulm: clear to auscultation and percussion, respiratory effort normal CV: regular rate and rhythm, S1-S2, no murmur, rub or gallop, no bruits, peripheral pulses normal and symmetric, no cyanosis, clubbing, edema or varicosities Chest: no  scars, masses GI: soft, non-tender; no hepatosplenomegaly, masses; active bowel sounds all quadrants GU: no hernia, testicular mass, penile discharge Lymph: no cervical, axillary or inguinal adenopathy MSK: gait normal, muscle tone and strength WNL, no joint swelling, effusions, discoloration, crepitus  SKIN: clear, good turgor, color WNL, no rashes, lesions, or ulcerations Neuro: normal mental status, normal strength, sensation, and motion Psych: alert; oriented to person, place and time, normally interactive and not anxious or depressed in appearance. All labs reviewed with patient.  Lipids:    Component Value Date/Time   CHOL 151 12/07/2013 1224   TRIG 101.0 12/07/2013 1224   HDL 33.70* 12/07/2013 1224   LDLDIRECT 106.6 11/01/2012 1440   VLDL 20.2 12/07/2013 1224   CHOLHDL 4 12/07/2013 1224   CBC: CBC Latest Ref Rng 12/07/2013 11/01/2012  WBC 4.0 - 10.5 K/uL 7.8 10.0  Hemoglobin 13.0 - 17.0 g/dL 14.6 14.0  Hematocrit 39.0 - 52.0 % 43.1 41.0  Platelets 150.0 - 400.0 K/uL 253.0 734.1    Basic Metabolic Panel:    Component Value Date/Time   NA 139 12/07/2013 1224   K 4.1 12/07/2013 1224   CL 106 12/07/2013 1224   CO2 27 12/07/2013 1224   BUN 14 12/07/2013 1224   CREATININE 1.0 12/07/2013 1224   GLUCOSE 96 12/07/2013 1224   CALCIUM 9.1 12/07/2013 1224   Hepatic Function Latest Ref Rng 12/07/2013 11/01/2012  Total Protein 6.0 - 8.3 g/dL 6.8 6.9  Albumin 3.5 - 5.2 g/dL 3.9 4.1  AST 0 - 37 U/L 18 23  ALT 0 - 53 U/L 19 38  Alk Phosphatase 39 - 117 U/L 87 83  Total Bilirubin 0.2 - 1.2 mg/dL 1.0 0.6  Bilirubin, Direct 0.0 - 0.3 mg/dL 0.1 0.1    Lab Results  Component Value Date   TSH 1.31 12/07/2013   Lab Results  Component Value Date   PSA 0.58 12/07/2013   PSA 0.75 11/01/2012    Assessment and Plan:   Health care maintenance  Need for prophylactic vaccination with combined diphtheria-tetanus-pertussis (DTP) vaccine - Plan: Tdap vaccine greater than or equal to 7yo IM  Health  Maintenance Exam: The patient's preventative maintenance and recommended screening tests for an annual wellness exam were reviewed in full today. Brought up to date unless services declined.  Counselled on the importance of diet, exercise, and its role in overall health and mortality. The patient's FH and SH was reviewed, including their home life, tobacco status, and drug and alcohol status.  Overall doing great Declines flu shot  Follow-up: No Follow-up on file. Or follow-up in 1 year for complete physical examination  New Prescriptions   No medications on file   Orders Placed This Encounter  Procedures  . Tdap vaccine greater than or equal to 7yo IM    Signed,  Benuel Ly T. Ozzie Remmers, MD   Patient's Medications  New Prescriptions   No medications on file  Previous Medications   ASPIRIN 81 MG TABLET    Take 81 mg by mouth daily.   ATORVASTATIN (LIPITOR) 40 MG TABLET    Take 40 mg by  mouth daily.   MULTIPLE VITAMIN (MULTIVITAMIN) TABLET    Take 1 tablet by mouth daily.  Modified Medications   No medications on file  Discontinued Medications   No medications on file

## 2013-12-17 ENCOUNTER — Encounter: Payer: Self-pay | Admitting: *Deleted

## 2014-01-29 ENCOUNTER — Other Ambulatory Visit: Payer: Self-pay | Admitting: Physician Assistant

## 2014-04-24 ENCOUNTER — Other Ambulatory Visit: Payer: Self-pay | Admitting: *Deleted

## 2014-04-24 MED ORDER — ATORVASTATIN CALCIUM 40 MG PO TABS: 40.0000 mg | ORAL_TABLET | Freq: Every day | ORAL | Status: DC

## 2014-11-18 ENCOUNTER — Other Ambulatory Visit: Payer: Self-pay | Admitting: Family Medicine

## 2014-11-18 DIAGNOSIS — Z79899 Other long term (current) drug therapy: Secondary | ICD-10-CM

## 2014-11-18 DIAGNOSIS — Z125 Encounter for screening for malignant neoplasm of prostate: Secondary | ICD-10-CM

## 2014-11-18 DIAGNOSIS — E785 Hyperlipidemia, unspecified: Secondary | ICD-10-CM

## 2014-11-20 ENCOUNTER — Other Ambulatory Visit (INDEPENDENT_AMBULATORY_CARE_PROVIDER_SITE_OTHER): Payer: Managed Care, Other (non HMO)

## 2014-11-20 DIAGNOSIS — Z79899 Other long term (current) drug therapy: Secondary | ICD-10-CM | POA: Diagnosis not present

## 2014-11-20 DIAGNOSIS — Z125 Encounter for screening for malignant neoplasm of prostate: Secondary | ICD-10-CM | POA: Diagnosis not present

## 2014-11-20 DIAGNOSIS — E785 Hyperlipidemia, unspecified: Secondary | ICD-10-CM

## 2014-11-20 LAB — CBC WITH DIFFERENTIAL/PLATELET
BASOS ABS: 0 10*3/uL (ref 0.0–0.1)
BASOS PCT: 0.4 % (ref 0.0–3.0)
EOS ABS: 0.3 10*3/uL (ref 0.0–0.7)
Eosinophils Relative: 3.1 % (ref 0.0–5.0)
HEMATOCRIT: 44.8 % (ref 39.0–52.0)
HEMOGLOBIN: 15.1 g/dL (ref 13.0–17.0)
LYMPHS PCT: 23.6 % (ref 12.0–46.0)
Lymphs Abs: 2 10*3/uL (ref 0.7–4.0)
MCHC: 33.8 g/dL (ref 30.0–36.0)
MCV: 88.2 fl (ref 78.0–100.0)
MONO ABS: 0.6 10*3/uL (ref 0.1–1.0)
Monocytes Relative: 7.8 % (ref 3.0–12.0)
NEUTROS ABS: 5.4 10*3/uL (ref 1.4–7.7)
Neutrophils Relative %: 65.1 % (ref 43.0–77.0)
PLATELETS: 239 10*3/uL (ref 150.0–400.0)
RBC: 5.08 Mil/uL (ref 4.22–5.81)
RDW: 13.4 % (ref 11.5–15.5)
WBC: 8.3 10*3/uL (ref 4.0–10.5)

## 2014-11-20 LAB — PSA: PSA: 0.66 ng/mL (ref 0.10–4.00)

## 2014-11-20 LAB — LIPID PANEL
CHOLESTEROL: 152 mg/dL (ref 0–200)
HDL: 40.7 mg/dL (ref >39.00)
LDL Cholesterol: 88 mg/dL (ref 0–99)
NonHDL: 111.75
Total CHOL/HDL Ratio: 4
Triglycerides: 121 mg/dL (ref 0.0–149.0)
VLDL: 24.2 mg/dL (ref 0.0–40.0)

## 2014-11-20 LAB — HEPATIC FUNCTION PANEL
ALBUMIN: 4.1 g/dL (ref 3.5–5.2)
ALK PHOS: 89 U/L (ref 39–117)
ALT: 20 U/L (ref 0–53)
AST: 15 U/L (ref 0–37)
Bilirubin, Direct: 0.2 mg/dL (ref 0.0–0.3)
Total Bilirubin: 0.8 mg/dL (ref 0.2–1.2)
Total Protein: 6.7 g/dL (ref 6.0–8.3)

## 2014-11-20 LAB — BASIC METABOLIC PANEL
BUN: 12 mg/dL (ref 6–23)
CALCIUM: 9.2 mg/dL (ref 8.4–10.5)
CO2: 27 mEq/L (ref 19–32)
CREATININE: 0.93 mg/dL (ref 0.40–1.50)
Chloride: 105 mEq/L (ref 96–112)
GFR: 90.45 mL/min (ref >60.00)
Glucose, Bld: 96 mg/dL (ref 70–99)
Potassium: 4.1 mEq/L (ref 3.5–5.1)
Sodium: 141 mEq/L (ref 135–145)

## 2014-11-27 ENCOUNTER — Encounter: Payer: Self-pay | Admitting: Family Medicine

## 2014-11-27 ENCOUNTER — Ambulatory Visit (INDEPENDENT_AMBULATORY_CARE_PROVIDER_SITE_OTHER): Payer: Managed Care, Other (non HMO) | Admitting: Family Medicine

## 2014-11-27 VITALS — BP 124/82 | HR 68 | Temp 98.3°F | Ht 69.0 in | Wt 194.5 lb

## 2014-11-27 DIAGNOSIS — Z23 Encounter for immunization: Secondary | ICD-10-CM | POA: Diagnosis not present

## 2014-11-27 DIAGNOSIS — Z0000 Encounter for general adult medical examination without abnormal findings: Secondary | ICD-10-CM

## 2014-11-27 MED ORDER — ATORVASTATIN CALCIUM 20 MG PO TABS: 20.0000 mg | ORAL_TABLET | Freq: Every day | ORAL | Status: DC

## 2014-11-27 MED ORDER — ATORVASTATIN CALCIUM 40 MG PO TABS: 40.0000 mg | ORAL_TABLET | Freq: Every day | ORAL | Status: DC

## 2014-11-27 NOTE — Progress Notes (Signed)
Dr. Frederico Hamman T. Copland, MD, [facility] [place], [address] Phone: [phone] Fax: [phone]  11/27/2014  Patient: Willie Johnson, MRN: [id number], DOB: 01/29/1961, 54 y.o.  Primary Physician:  Owens Loffler, MD  Chief Complaint: Annual Exam  Subjective:   Willie Johnson is a 54 y.o. pleasant patient who presents with the following:  Preventative Health Maintenance Visit:  Health Maintenance Summary Reviewed and updated, unless pt declines services.  Tobacco History Reviewed. Vaping. Alcohol: No concerns, no excessive use Exercise Habits: Some activity, rec at least 30 mins 5 times a week STD concerns: no risk or activity to increase risk Drug Use: None Encouraged self-testicular check  Health Maintenance  Topic Date Due  . Hepatitis C Screening  January 10, 1961  . HIV Screening  03/25/1977  . INFLUENZA VACCINE  10/07/2015  . COLONOSCOPY  05/08/2023  . TETANUS/TDAP  12/13/2023   Immunization History  Administered Date(s) Administered  . Influenza,inj,Quad PF,36+ Mos 11/27/2014  . Influenza-Unspecified 11/29/2013  . Tdap 12/12/2013   Patient Active Problem List   Diagnosis Date Noted  . Hyperlipidemia   . Allergic rhinitis due to pollen    Past Medical History  Diagnosis Date  . Allergic rhinitis due to pollen   . Hyperlipidemia    Past Surgical History  Procedure Laterality Date  . Fracture surgery     Social History   Social History  . Marital Status: Single    Spouse Name: N/A  . Number of Children: N/A  . Years of Education: N/A   Occupational History  . software    Social History Main Topics  . Smoking status: Former Smoker -- 20 years    Quit date: 04/08/2012  . Smokeless tobacco: Current User     Comment: Vape  . Alcohol Use: No  . Drug Use: No  . Sexual Activity:    Partners: Male   Other Topics Concern  . 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 Arthritis Mother   . Hyperlipidemia Mother   . Heart disease Mother   . Hypertension Mother   . Diabetes Mother   . Hyperlipidemia Father   . Heart disease Father   . Stroke Father   . Pulmonary embolism Sister   . Colon cancer Neg Hx   . Pancreatic cancer Neg Hx   . Stomach cancer Neg Hx    No Known Allergies  Medication list has been reviewed and updated.   General: Denies fever, chills, sweats. No significant weight loss. Eyes: Denies blurring,significant itching ENT: Denies earache, sore throat, and hoarseness. Cardiovascular: Denies chest pains, palpitations, dyspnea on exertion Respiratory: Denies cough, dyspnea at rest,wheeezing Breast: no concerns about lumps GI: Denies nausea, vomiting, diarrhea, constipation, change in bowel habits, abdominal pain, melena, hematochezia GU: Denies penile discharge, ED, urinary flow / outflow problems. No STD concerns. Musculoskeletal: Denies back pain, joint pain Derm: Denies rash, itching Neuro: Denies  paresthesias, frequent falls, frequent headaches Psych: Denies depression, anxiety Endocrine: Denies cold intolerance, heat intolerance, polydipsia Heme: Denies enlarged lymph nodes Allergy: No hayfever  Objective:   BP 124/82 mmHg  Pulse 68  Temp(Src) 98.3 F (36.8 C) (Oral)  Ht _0  (1.753 m)  Wt 194 lb 8 oz (88.225 kg)  BMI 28.71  kg/m2 Ideal Body Weight: Weight in (lb) to have BMI = 25: 168.9  No exam data present  GEN: well developed, well nourished, no acute distress Eyes: conjunctiva and lids normal, PERRLA, EOMI ENT: TM clear, nares clear, oral exam WNL Neck: supple, no lymphadenopathy, no thyromegaly, no JVD Pulm: clear to auscultation and percussion, respiratory effort normal CV: regular rate and rhythm, S1-S2, no murmur, rub or gallop, no bruits, peripheral pulses normal  and symmetric, no cyanosis, clubbing, edema or varicosities GI: soft, non-tender; no hepatosplenomegaly, masses; active bowel sounds all quadrants GU: no hernia, testicular mass, penile discharge Lymph: no cervical, axillary or inguinal adenopathy MSK: gait normal, muscle tone and strength WNL, no joint swelling, effusions, discoloration, crepitus  SKIN: clear, good turgor, color WNL, no rashes, lesions, or ulcerations Neuro: normal mental status, normal strength, sensation, and motion Psych: alert; oriented to person, place and time, normally interactive and not anxious or depressed in appearance. All labs reviewed with patient.  Lipids:    Component Value Date/Time   CHOL 152 11/20/2014 0800   TRIG 121.0 11/20/2014 0800   HDL 40.70 11/20/2014 0800   LDLDIRECT 106.6 11/01/2012 1440   VLDL 24.2 11/20/2014 0800   CHOLHDL 4 11/20/2014 0800   CBC: CBC Latest Ref Rng 11/20/2014 12/07/2013 11/01/2012  WBC 4.0 - 10.5 K/uL 8.3 7.8 10.0  Hemoglobin 13.0 - 17.0 g/dL 15.1 14.6 14.0  Hematocrit 39.0 - 52.0 % 44.8 43.1 41.0  Platelets 150.0 - 400.0 K/uL 239.0 253.0 384.5    Basic Metabolic Panel:    Component Value Date/Time   NA 141 11/20/2014 0800   K 4.1 11/20/2014 0800   CL 105 11/20/2014 0800   CO2 27 11/20/2014 0800   BUN 12 11/20/2014 0800   CREATININE 0.93 11/20/2014 0800   GLUCOSE 96 11/20/2014 0800   CALCIUM 9.2 11/20/2014 0800   Hepatic Function Latest Ref Rng 11/20/2014 12/07/2013 11/01/2012  Total Protein 6.0 - 8.3 g/dL 6.7 6.8 6.9  Albumin 3.5 - 5.2 g/dL 4.1 3.9 4.1  AST 0 - 37 U/L _0 ALT 0 - 53 U/L 20 19 38  Alk Phosphatase 39 - 117 U/L 89 87 83  Total Bilirubin 0.2 - 1.2 mg/dL 0.8 1.0 0.6  Bilirubin, Direct 0.0 - 0.3 mg/dL 0.2 0.1 0.1    Lab Results  Component Value Date   TSH 1.31 12/07/2013   Lab Results  Component Value Date   PSA 0.66 11/20/2014   PSA 0.58 12/07/2013   PSA 0.75 11/01/2012    Assessment and Plan:   Health care maintenance  Need  for prophylactic vaccination and inoculation against influenza - Plan: Flu Vaccine QUAD 36+ mos IM  Health Maintenance Exam: The patient's preventative maintenance and recommended screening tests for an annual wellness exam were reviewed in full today. Brought up to date unless services declined.  Counselled on the importance of diet, exercise, and its role in overall health and mortality. The patient's FH and SH was reviewed, including their home life, tobacco status, and drug and alcohol status.  Doing well overall, decrease to lipitor 20 mg  Follow-up: No Follow-up on file. Unless noted, follow-up in 1 year for Health Maintenance Exam.  New Prescriptions   No medications on file   Orders Placed This Encounter  Procedures  . Flu Vaccine QUAD 36+ mos IM    Signed,  Spencer T. Copland, MD   Patient's Medications  New Prescriptions   No medications on file  Previous Medications   ASPIRIN  81 MG TABLET    Take 81 mg by mouth daily.   CLOBETASOL CREAM (TEMOVATE) 0.05 %    APPLY TWICE DAILY TO AFFECTED AREAS AS NEEDED. NOT TO FACE, GROIN OR UNDERARMS   MULTIPLE VITAMIN (MULTIVITAMIN) TABLET    Take 1 tablet by mouth daily.  Modified Medications   Modified Medication Previous Medication   ATORVASTATIN (LIPITOR) 20 MG TABLET atorvastatin (LIPITOR) 40 MG tablet      Take 1 tablet (20 mg total) by mouth daily.    Take 1 tablet (40 mg total) by mouth daily.  Discontinued Medications   FLUOCINONIDE CREAM (LIDEX) 0.05 %

## 2014-11-27 NOTE — Progress Notes (Signed)
Pre visit review using our clinic review tool, if applicable. No additional management support is needed unless otherwise documented below in the visit note. 

## 2015-03-17 ENCOUNTER — Encounter: Payer: Self-pay | Admitting: Family Medicine

## 2015-03-17 ENCOUNTER — Ambulatory Visit (INDEPENDENT_AMBULATORY_CARE_PROVIDER_SITE_OTHER): Payer: Managed Care, Other (non HMO) | Admitting: Family Medicine

## 2015-03-17 VITALS — BP 124/80 | HR 72 | Temp 97.7°F | Ht 69.0 in | Wt 211.8 lb

## 2015-03-17 DIAGNOSIS — R1013 Epigastric pain: Secondary | ICD-10-CM

## 2015-03-17 LAB — H. PYLORI ANTIBODY, IGG: H PYLORI IGG: NEGATIVE

## 2015-03-17 NOTE — Patient Instructions (Signed)
GERD  Very Common Backflow of stomach acid and contents into esophagus Causes heartburn Can cause chest pain, difficulty swallowing  Prevention 1. Do not smoke 2. Lose weight if overweight 3. Elevate bed 4-6 inches 4. Some foods worsen: chocolate, mint, alcohol, OJ, caffeine, fat, fried or spicy food.  Treatment 1. Mild to Moderate: OTC Zantac or Pepcid often works, take 6-12 weeks, then as needed 2. More severe: OTC Prilosec or OTC Prevacid, Often needed 3-6 months or on daily basis  

## 2015-03-17 NOTE — Progress Notes (Signed)
Pre visit review using our clinic review tool, if applicable. No additional management support is needed unless otherwise documented below in the visit note. 

## 2015-03-17 NOTE — Progress Notes (Signed)
Dr. Frederico Hamman T. Nairobi Gustafson, MD, [facility] Medicine [place], [address] Phone: [phone] Fax: [phone]  03/17/2015  Patient: Willie Johnson, MRN: [id number], DOB: 07/13/1960, 55 y.o.  Primary Physician:  Owens Loffler, MD   Chief Complaint  Patient presents with  . Gastroesophageal Reflux   Subjective:   Willie Johnson is a 55 y.o. very pleasant male patient who presents with the following:  Having some chest pain, indigestion. Has taken some Tums. Saturday was really hurting. No diarrhea or nausea.   Central chest pain and in the shoulder blades. A lot of belching and bloating.  Prilosec for a couple of days.   Ibuprofen a couple of times a week.   Normal nuclear stress in 2013.   Wt Readings from Last 3 Encounters:  03/17/15 211 lb 12 oz (96.049 kg)  11/27/14 194 lb 8 oz (88.225 kg)  12/12/13 192 lb 8 oz (87.317 kg)    17 pound weight gain.  Past Medical History, Surgical History, Social History, Family History, Problem List, Medications, and Allergies have been reviewed and updated if relevant.  Patient Active Problem List   Diagnosis Date Noted  . Hyperlipidemia   . Allergic rhinitis due to pollen     Past Medical History  Diagnosis Date  . Allergic rhinitis due to pollen   . Hyperlipidemia     Past Surgical History  Procedure Laterality Date  . Fracture surgery      Social History   Social History  . Marital Status: Single    Spouse Name: N/A  . Number of Children: N/A  . Years of Education: N/A   Occupational History  . software    Social History Main Topics  . Smoking status: Former Smoker -- 20 years    Quit date: 04/08/2012  . Smokeless tobacco: Current User     Comment: Vape  . Alcohol Use: No  . Drug Use: No  . Sexual Activity:    Partners: Male   Other Topics Concern  . 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 Arthritis Mother   . Hyperlipidemia Mother   . Heart disease Mother   . Hypertension Mother   . Diabetes Mother   . Hyperlipidemia Father   . Heart disease Father   . Stroke Father   . Pulmonary embolism Sister   . Colon cancer Neg Hx   . Pancreatic cancer Neg Hx   . Stomach cancer Neg Hx     No Known Allergies  Medication list reviewed and updated in full in [place].   GEN: No acute illnesses, no fevers, chills. GI: No n/v/d, eating normally Pulm: No SOB Interactive and getting along well at home.  Otherwise, ROS is as per the HPI.  Objective:   BP 124/80 mmHg  Pulse 72  Temp(Src) 97.7 F (36.5 C) (Oral)  Ht 5\' 9"  (1.753 m)  Wt 211 lb 12 oz (96.049 kg)  BMI 31.26 kg/m2  GEN: WDWN, NAD, Non-toxic, A & O x 3 HEENT: Atraumatic, Normocephalic. Neck supple. No masses, No LAD. Ears and Nose: No external deformity. CV: RRR, No M/G/R. No JVD. No thrill. No extra heart sounds. PULM: CTA B, no wheezes, crackles, rhonchi. No retractions. No resp. distress. No  accessory muscle use. ABD: S, NT, ND, + BS, No rebound, No HSM  EXTR: No c/c/e NEURO Normal gait.  PSYCH: Normally interactive. Conversant. Not depressed or anxious appearing.  Calm demeanor.   Laboratory and Imaging Data: Results for orders placed or performed in visit on 03/17/15  H. pylori antibody, IgG  Result Value Ref Range   H Pylori IgG Negative Negative     Assessment and Plan:   Dyspepsia - Plan: H. pylori antibody, IgG  Classic reflux by history  Patient Instructions  GERD  Very Common Backflow of stomach acid and contents into esophagus Causes heartburn Can cause chest pain, difficulty swallowing  Prevention 1. Do not smoke 2. Lose weight if overweight 3. Elevate bed 4-6 inches 4. Some foods worsen: chocolate, mint, alcohol, OJ, caffeine, fat, fried or spicy  food.  Treatment 1. Mild to Moderate: OTC Zantac or Pepcid often works, take 6-12 weeks, then as needed 2. More severe: OTC Prilosec or OTC Prevacid, Often needed 3-6 months or on daily basis      Follow-up: No Follow-up on file.  Orders Placed This Encounter  Procedures  . H. pylori antibody, IgG    Signed,  Bassel Gaskill T. Emree Locicero, MD   Patient's Medications  New Prescriptions   No medications on file  Previous Medications   ASPIRIN 81 MG TABLET    Take 81 mg by mouth daily.   ATORVASTATIN (LIPITOR) 20 MG TABLET    Take 1 tablet (20 mg total) by mouth daily.   MULTIPLE VITAMIN (MULTIVITAMIN) TABLET    Take 1 tablet by mouth daily.  Modified Medications   No medications on file  Discontinued Medications   CLOBETASOL CREAM (TEMOVATE) 0.05 %    APPLY TWICE DAILY TO AFFECTED AREAS AS NEEDED. NOT TO FACE, GROIN OR UNDERARMS

## 2015-04-18 ENCOUNTER — Encounter: Payer: Self-pay | Admitting: Family Medicine

## 2015-04-18 ENCOUNTER — Ambulatory Visit (INDEPENDENT_AMBULATORY_CARE_PROVIDER_SITE_OTHER): Payer: Managed Care, Other (non HMO) | Admitting: Family Medicine

## 2015-04-18 VITALS — BP 124/86 | HR 91 | Temp 98.6°F | Ht 69.0 in | Wt 213.0 lb

## 2015-04-18 DIAGNOSIS — R300 Dysuria: Secondary | ICD-10-CM | POA: Diagnosis not present

## 2015-04-18 LAB — POCT URINALYSIS DIPSTICK
BILIRUBIN UA: NEGATIVE
Blood, UA: NEGATIVE
GLUCOSE UA: NEGATIVE
KETONES UA: NEGATIVE
LEUKOCYTES UA: NEGATIVE
Nitrite, UA: NEGATIVE
Protein, UA: NEGATIVE
Spec Grav, UA: 1.01
Urobilinogen, UA: 0.2
pH, UA: 6

## 2015-04-18 NOTE — Progress Notes (Signed)
Pre visit review using our clinic review tool, if applicable. No additional management support is needed unless otherwise documented below in the visit note. 

## 2015-04-18 NOTE — Patient Instructions (Signed)
Nice to meet you. We're going to send a urine culture to evaluate your urine. If this continues to be an issue referral to urologist if the urine culture is negative. If you develop abdominal pain that is persistent, fevers, nausea, vomiting, diarrhea, blood in her stool, blood in your urine, or any new or change in symptoms please seek medical attention.

## 2015-04-18 NOTE — Assessment & Plan Note (Addendum)
Patient with intermittent dysuria over the last year. Associated with suprapubic discomfort, urgency, and frequency. UA was unremarkable. We will send this for urine culture. Could be kidney stone, though no hematuria. STD unlikely in patient with no discharge and is monogamous with single partner. If urine culture is negative we'll consider urology referral for further evaluation. Given return precautions.

## 2015-04-18 NOTE — Progress Notes (Signed)
Patient ID: Willie Johnson, male   DOB: May 15, 1960, 55 y.o.   MRN: [id number]  Willie Rumps, MD Phone: [phone]  Willie Johnson is a 55 y.o. male who presents today for same-day visit.  Patient notes in the last year he has had episodes of dysuria and suprapubic pain that occur for 3-4 days at a time and have occurred 4 times over the last year. When this occurs he has some frequency and some urgency. Notes the discomfort improves after he urinates. He notes some lower stomach cramps and bloating with this. No nausea, vomiting, or diarrhea. No fevers. No history kidney stones. No hematuria. He is sexually active with one partner and they're monogamous for the last 20 years. No penile discharge. No history of STDs. Most recent episode started 2 days ago. Is improved some today.  PMH: nonsmoker   ROS see history of present illness  Objective  Physical Exam Filed Vitals:   04/18/15 1302  BP: 124/86  Pulse: 91  Temp: 98.6 F (37 C)    BP Readings from Last 3 Encounters:  04/18/15 124/86  03/17/15 124/80  11/27/14 124/82   Wt Readings from Last 3 Encounters:  04/18/15 213 lb (96.616 kg)  03/17/15 211 lb 12 oz (96.049 kg)  11/27/14 194 lb 8 oz (88.225 kg)    Physical Exam  Constitutional: He is well-developed, well-nourished, and in no distress.  HENT:  Head: Normocephalic and atraumatic.  Cardiovascular: Normal rate, regular rhythm and normal heart sounds.  Exam reveals no gallop and no friction rub.   No murmur heard. Pulmonary/Chest: Effort normal and breath sounds normal. No respiratory distress. He has no wheezes. He has no rales.  Abdominal: Soft. He exhibits no distension. There is tenderness (suprapubic). There is no rebound and no guarding.  No right lower quadrant or McBurney's point tenderness  Genitourinary: Rectum normal, prostate normal and penis normal. No discharge found.  Normal testicles and scrotum, no inguinal hernias  Neurological: He is alert.  Gait normal.  Skin: Skin is warm and dry. He is not diaphoretic.     Assessment/Plan: Please see individual problem list.  Dysuria Patient with intermittent dysuria over the last year. Associated with suprapubic discomfort, urgency, and frequency. UA was unremarkable. We will send this for urine culture. Could be kidney stone, though no hematuria. STD unlikely in patient with no discharge and is monogamous with single partner. If urine culture is negative we'll consider urology referral for further evaluation. Given return precautions.    Orders Placed This Encounter  Procedures  . Urine Culture  . POCT Urinalysis Dipstick    Willie Johnson

## 2015-04-20 LAB — URINE CULTURE
COLONY COUNT: NO GROWTH
ORGANISM ID, BACTERIA: NO GROWTH

## 2015-05-28 ENCOUNTER — Telehealth: Payer: Self-pay | Admitting: Internal Medicine

## 2015-05-28 NOTE — Telephone Encounter (Signed)
Patient reports that he has a one year history of abdominal pain and cramping.  He reports pain gets very intense until he urinates then it improves. He has been to his primary care for this and he had no explanation for the urinary pain .  UA was negative.   He also reports indigestion and GERD.  He has been of and on a PPI and zantac.  No real relief in indigestion with both meds.  He is encouraged to see Urology about the urination problems.  He reports the pain comes and goes, lasts for a few weeks then resolves.  He is placed on the cancellation list and given an appt for 08/01/15.  He is encouraged to see Urology also until appt with Dr. Hilarie Fredrickson.

## 2015-07-14 ENCOUNTER — Encounter: Payer: Self-pay | Admitting: *Deleted

## 2015-08-01 ENCOUNTER — Encounter: Payer: Self-pay | Admitting: Internal Medicine

## 2015-08-01 ENCOUNTER — Other Ambulatory Visit (INDEPENDENT_AMBULATORY_CARE_PROVIDER_SITE_OTHER): Payer: Managed Care, Other (non HMO)

## 2015-08-01 ENCOUNTER — Ambulatory Visit (INDEPENDENT_AMBULATORY_CARE_PROVIDER_SITE_OTHER): Payer: Managed Care, Other (non HMO) | Admitting: Internal Medicine

## 2015-08-01 VITALS — BP 130/80 | HR 80 | Ht 69.0 in | Wt 218.0 lb

## 2015-08-01 DIAGNOSIS — K219 Gastro-esophageal reflux disease without esophagitis: Secondary | ICD-10-CM

## 2015-08-01 DIAGNOSIS — R1013 Epigastric pain: Secondary | ICD-10-CM

## 2015-08-01 LAB — TSH: TSH: 1.33 u[IU]/mL (ref 0.35–4.50)

## 2015-08-01 LAB — IGA: IGA: 146 mg/dL (ref 68–378)

## 2015-08-01 MED ORDER — PANTOPRAZOLE SODIUM 40 MG PO TBEC: 40.0000 mg | DELAYED_RELEASE_TABLET | Freq: Every day | ORAL | Status: DC

## 2015-08-01 NOTE — Progress Notes (Signed)
Patient ID: DILSON MALLY, male   DOB: 1960-03-18, 55 y.o.   MRN: [id number] HPI: Blanchard Alesi is a 55 year old male known to me from screening colonoscopy which was performed in March 2015 who is seen in consultation at the request of Owens Loffler, M.D. to evaluate GERD and indigestion. He reports that he has developed within the last 6 months indigestion associated with significant heartburn. He also has had some substernal chest pain and left-sided chest pain associated with belching and gas. This is interfered with sleep. No nausea or vomiting. No dysphagia or odynophagia. He has tried to avoid carbonated beverages and eliminated all diet sodas. He notes he has gained 20 pounds for unclear reasons in the last year. Initially used Tums with no relief. He then was prescribed Zantac 75 twice a day. This was not helpful and he increased to 150 mg twice daily. If he takes this twice a day every day his symptoms dramatically improved. He is also noticed increased gas and bloating which can be foul-smelling. He ordered a probiotic from [place] and has been using this daily without benefit.  Past Medical History  Diagnosis Date  . Allergic rhinitis due to pollen   . Hyperlipidemia   . Diverticulosis     Past Surgical History  Procedure Laterality Date  . Fracture surgery      Outpatient Prescriptions Prior to Visit  Medication Sig Dispense Refill  . aspirin 81 MG tablet Take 81 mg by mouth daily.    Marland Kitchen atorvastatin (LIPITOR) 20 MG tablet Take 1 tablet (20 mg total) by mouth daily. 90 tablet 3  . Multiple Vitamin (MULTIVITAMIN) tablet Take 1 tablet by mouth daily.     No facility-administered medications prior to visit.    No Known Allergies  Family History  Problem Relation Age of Onset  . Arthritis Mother   . Hyperlipidemia Mother   . Heart disease Mother   . Hypertension Mother   . Diabetes Mother   . Hyperlipidemia Father   . Heart disease Father   . Stroke Father   .  Pulmonary embolism Sister   . Colon cancer Neg Hx   . Pancreatic cancer Neg Hx   . Stomach cancer Neg Hx     Social History  Substance Use Topics  . Smoking status: Former Smoker -- 20 years    Quit date: 04/08/2012  . Smokeless tobacco: Current User     Comment: Vape  . Alcohol Use: No    ROS: As per history of present illness, otherwise negative  BP 130/80 mmHg  Pulse 80  Ht 5\' 9"  (1.753 m)  Wt 218 lb (98.884 kg)  BMI 32.18 kg/m2 Constitutional: Well-developed and well-nourished. No distress. HEENT: Normocephalic and atraumatic. Oropharynx is clear and moist. No oropharyngeal exudate. Conjunctivae are normal.  No scleral icterus. Neck: Neck supple. Trachea midline. Cardiovascular: Normal rate, regular rhythm and intact distal pulses. No M/R/G Pulmonary/chest: Effort normal and breath sounds normal. No wheezing, rales or rhonchi. Abdominal: Soft, nontender, nondistended. Bowel sounds active throughout. There are no masses palpable. No hepatosplenomegaly. Extremities: no clubbing, cyanosis, or edema Neurological: Alert and oriented to person place and time. Skin: Skin is warm and dry. No rashes noted. Psychiatric: Normal mood and affect. Behavior is normal.  RELEVANT LABS AND IMAGING: CBC    Component Value Date/Time   WBC 8.3 11/20/2014 0800   RBC 5.08 11/20/2014 0800   HGB 15.1 11/20/2014 0800   HCT 44.8 11/20/2014 0800   PLT 239.0 11/20/2014 0800  MCV 88.2 11/20/2014 0800   MCHC 33.8 11/20/2014 0800   RDW 13.4 11/20/2014 0800   LYMPHSABS 2.0 11/20/2014 0800   MONOABS 0.6 11/20/2014 0800   EOSABS 0.3 11/20/2014 0800   BASOSABS 0.0 11/20/2014 0800    CMP     Component Value Date/Time   NA 141 11/20/2014 0800   K 4.1 11/20/2014 0800   CL 105 11/20/2014 0800   CO2 27 11/20/2014 0800   GLUCOSE 96 11/20/2014 0800   BUN 12 11/20/2014 0800   CREATININE 0.93 11/20/2014 0800   CALCIUM 9.2 11/20/2014 0800   PROT 6.7 11/20/2014 0800   ALBUMIN 4.1 11/20/2014  0800   AST 15 11/20/2014 0800   ALT 20 11/20/2014 0800   ALKPHOS 89 11/20/2014 0800   BILITOT 0.8 11/20/2014 0800    ASSESSMENT/PLAN: 55 year old male known to me from screening colonoscopy which was performed in March 2015 who is seen in consultation at the request of Owens Loffler, M.D. to evaluate GERD and indigestion.   1. GERD/dyspepsia -- he has symptoms consistent with GERD and dyspepsia. Has responded nicely to H2 blocker but he is still having some breakthrough symptoms. For this reason I prescribed pantoprazole 40 mg daily. I would like him to use this for 2 months and then we will try to remove the medication to see if symptoms return. If symptoms do not completely improve with pantoprazole I would recommend upper endoscopy. He understands this recommendation. Check celiac panel and TSH today. GERD symptoms may have worsened over the past year due to weight gain. He will work to try to lose weight. Continue to avoid carbonated beverages, GERD diet discussed. Dr. overgrowth was considered and if bloating symptoms remain we could consider empiric therapy for this. Await response to pantoprazole. Follow-up in 3 months, though he was advised to call me if symptoms fail to improve within the first month.    [id number] [address], [address] [place], [place] [address]

## 2015-08-01 NOTE — Patient Instructions (Signed)
We have sent the following medications to your pharmacy for you to pick up at your convenience: Pantoprazole 40 mg daily  Please take pantoprazole 40 mg daily x 2 months and then discontinue.  If the pantoprazole does not help within 2-4 weeks, please call our office at [phone].  Follow up with Dr Hilarie Fredrickson in 3 months, sooner if necessary.  Your physician has requested that you go to the basement for the following lab work before leaving today: IgA, Ttg, TSH  If you are age 74 or older, your body mass index should be between 23-30. Your Body mass index is 32.18 kg/(m^2). If this is out of the aforementioned range listed, please consider follow up with your Primary Care Provider.  If you are age 39 or younger, your body mass index should be between 19-25. Your Body mass index is 32.18 kg/(m^2). If this is out of the aformentioned range listed, please consider follow up with your Primary Care Provider.

## 2015-08-05 LAB — TISSUE TRANSGLUTAMINASE, IGA: Tissue Transglutaminase Ab, IgA: 1 U/mL (ref <4)

## 2015-08-25 ENCOUNTER — Ambulatory Visit: Payer: Managed Care, Other (non HMO) | Admitting: Family Medicine

## 2015-09-10 ENCOUNTER — Telehealth: Payer: Self-pay | Admitting: Internal Medicine

## 2015-09-10 ENCOUNTER — Other Ambulatory Visit: Payer: Self-pay

## 2015-09-10 DIAGNOSIS — R109 Unspecified abdominal pain: Secondary | ICD-10-CM

## 2015-09-10 NOTE — Telephone Encounter (Signed)
Pt states he was seen about 6 weeks ago for indigestion. States that is much better but he is still having issues with pain in his left side under his rib cage. Pt states he would like to maybe have a ct scan to make sure everything is ok. Please advise.

## 2015-09-10 NOTE — Telephone Encounter (Signed)
We discussed cross-sectional imaging versus endoscopy. Okay for CT scan abdomen and pelvis with IV contrast, if unrevealing and left upper quadrant pain continues EGD would be recommended Continue PPI for now

## 2015-09-10 NOTE — Telephone Encounter (Signed)
Left message for pt to call back. Pt scheduled for CT of A/P at [facility] 09/17/15@2 :30pm. Pt to arrive there at 2:15pm. Pt to be NPO after 10:30am except for bottle 1 of contrast at 12:30 and bottle 2 @1 :30pm.   Spoke with pt and he is aware, contrast left up front for pick-up.

## 2015-09-17 ENCOUNTER — Inpatient Hospital Stay: Admission: RE | Admit: 2015-09-17 | Payer: Managed Care, Other (non HMO) | Source: Ambulatory Visit

## 2015-09-30 ENCOUNTER — Ambulatory Visit (INDEPENDENT_AMBULATORY_CARE_PROVIDER_SITE_OTHER)
Admission: RE | Admit: 2015-09-30 | Discharge: 2015-09-30 | Disposition: A | Discharge status: Home / Self Care | Payer: Managed Care, Other (non HMO) | Source: Ambulatory Visit | Attending: Internal Medicine | Admitting: Internal Medicine

## 2015-09-30 DIAGNOSIS — R109 Unspecified abdominal pain: Secondary | ICD-10-CM | POA: Diagnosis not present

## 2015-09-30 MED ORDER — IOPAMIDOL (ISOVUE-300) INJECTION 61%
100.0000 mL | Freq: Once | INTRAVENOUS | Status: AC | PRN
  Administered 2015-09-30: 100 mL via INTRAVENOUS

## 2015-12-18 ENCOUNTER — Telehealth: Payer: Self-pay | Admitting: Family Medicine

## 2015-12-18 NOTE — Telephone Encounter (Signed)
Please call and schedule CPE with fasting lab for Dr. Lorelei Pont.

## 2015-12-19 NOTE — Telephone Encounter (Signed)
Lab 11/8 cpx 11/13 Pt aware

## 2015-12-19 NOTE — Telephone Encounter (Signed)
Left message asking pt to call office  °

## 2016-01-13 ENCOUNTER — Other Ambulatory Visit: Payer: Self-pay | Admitting: Family Medicine

## 2016-01-13 DIAGNOSIS — Z79899 Other long term (current) drug therapy: Secondary | ICD-10-CM

## 2016-01-13 DIAGNOSIS — Z125 Encounter for screening for malignant neoplasm of prostate: Secondary | ICD-10-CM

## 2016-01-13 DIAGNOSIS — E7849 Other hyperlipidemia: Secondary | ICD-10-CM

## 2016-01-14 ENCOUNTER — Other Ambulatory Visit (INDEPENDENT_AMBULATORY_CARE_PROVIDER_SITE_OTHER): Payer: Managed Care, Other (non HMO)

## 2016-01-14 DIAGNOSIS — Z79899 Other long term (current) drug therapy: Secondary | ICD-10-CM | POA: Diagnosis not present

## 2016-01-14 DIAGNOSIS — Z125 Encounter for screening for malignant neoplasm of prostate: Secondary | ICD-10-CM | POA: Diagnosis not present

## 2016-01-14 DIAGNOSIS — E784 Other hyperlipidemia: Secondary | ICD-10-CM | POA: Diagnosis not present

## 2016-01-14 DIAGNOSIS — E7849 Other hyperlipidemia: Secondary | ICD-10-CM

## 2016-01-14 LAB — CBC WITH DIFFERENTIAL/PLATELET
BASOS PCT: 0.6 % (ref 0.0–3.0)
Basophils Absolute: 0 10*3/uL (ref 0.0–0.1)
EOS ABS: 0.3 10*3/uL (ref 0.0–0.7)
Eosinophils Relative: 3.2 % (ref 0.0–5.0)
HEMATOCRIT: 42 % (ref 39.0–52.0)
Hemoglobin: 15 g/dL (ref 13.0–17.0)
LYMPHS PCT: 26.5 % (ref 12.0–46.0)
Lymphs Abs: 2.2 10*3/uL (ref 0.7–4.0)
MCHC: 35.8 g/dL (ref 30.0–36.0)
MCV: 85 fl (ref 78.0–100.0)
Monocytes Absolute: 0.6 10*3/uL (ref 0.1–1.0)
Monocytes Relative: 7.4 % (ref 3.0–12.0)
NEUTROS ABS: 5.2 10*3/uL (ref 1.4–7.7)
Neutrophils Relative %: 62.3 % (ref 43.0–77.0)
PLATELETS: 275 10*3/uL (ref 150.0–400.0)
RBC: 4.93 Mil/uL (ref 4.22–5.81)
RDW: 14.1 % (ref 11.5–15.5)
WBC: 8.3 10*3/uL (ref 4.0–10.5)

## 2016-01-14 LAB — HEPATIC FUNCTION PANEL
ALK PHOS: 76 U/L (ref 39–117)
ALT: 29 U/L (ref 0–53)
AST: 14 U/L (ref 0–37)
Albumin: 4 g/dL (ref 3.5–5.2)
BILIRUBIN DIRECT: 0.1 mg/dL (ref 0.0–0.3)
Total Bilirubin: 0.4 mg/dL (ref 0.2–1.2)
Total Protein: 6.6 g/dL (ref 6.0–8.3)

## 2016-01-14 LAB — BASIC METABOLIC PANEL
BUN: 12 mg/dL (ref 6–23)
CHLORIDE: 106 meq/L (ref 96–112)
CO2: 28 meq/L (ref 19–32)
CREATININE: 0.89 mg/dL (ref 0.40–1.50)
Calcium: 9.4 mg/dL (ref 8.4–10.5)
GFR: 94.75 mL/min (ref >60.00)
Glucose, Bld: 112 mg/dL — ABNORMAL HIGH (ref 70–99)
Potassium: 4.5 mEq/L (ref 3.5–5.1)
Sodium: 141 mEq/L (ref 135–145)

## 2016-01-14 LAB — PSA: PSA: 0.9 ng/mL (ref 0.10–4.00)

## 2016-01-14 LAB — LIPID PANEL
CHOL/HDL RATIO: 5
Cholesterol: 165 mg/dL (ref 0–200)
HDL: 36.4 mg/dL — ABNORMAL LOW (ref >39.00)
LDL Cholesterol: 93 mg/dL (ref 0–99)
NONHDL: 129.02
Triglycerides: 179 mg/dL — ABNORMAL HIGH (ref 0.0–149.0)
VLDL: 35.8 mg/dL (ref 0.0–40.0)

## 2016-01-19 ENCOUNTER — Ambulatory Visit (INDEPENDENT_AMBULATORY_CARE_PROVIDER_SITE_OTHER): Payer: Managed Care, Other (non HMO) | Admitting: Family Medicine

## 2016-01-19 ENCOUNTER — Encounter: Payer: Self-pay | Admitting: Family Medicine

## 2016-01-19 VITALS — BP 136/86 | HR 69 | Temp 98.3°F | Ht 68.5 in | Wt 217.2 lb

## 2016-01-19 DIAGNOSIS — Z0000 Encounter for general adult medical examination without abnormal findings: Secondary | ICD-10-CM

## 2016-01-19 DIAGNOSIS — Z23 Encounter for immunization: Secondary | ICD-10-CM | POA: Diagnosis not present

## 2016-01-19 NOTE — Progress Notes (Signed)
Pre visit review using our clinic review tool, if applicable. No additional management support is needed unless otherwise documented below in the visit note. 

## 2016-01-19 NOTE — Progress Notes (Signed)
Dr. Frederico Hamman T. Lenox Bink, MD, [facility] [place], [address] Phone: [phone] Fax: [phone]  01/19/2016  Patient: Willie Johnson, MRN: [id number], DOB: 06-09-1960, 55 y.o.  Primary Physician:  Owens Loffler, MD   Chief Complaint  Patient presents with  . Annual Exam   Subjective:   Willie Johnson is a 55 y.o. pleasant patient who presents with the following:  Preventative Health Maintenance Visit:  Health Maintenance Summary Reviewed and updated, unless pt declines services.  Tobacco History Reviewed. 1/2 ppd now Alcohol: No concerns, no excessive use Exercise Habits: Some activity, rec at least 30 mins 5 times a week STD concerns: no risk or activity to increase risk Drug Use: None Encouraged self-testicular check  Indigestion, then went to see Zenovia Jarred - having some bad  Half a pack a day  Health Maintenance  Topic Date Due  . Hepatitis C Screening  04-May-1960  . HIV Screening  03/25/1977  . COLONOSCOPY  05/08/2023  . TETANUS/TDAP  12/13/2023  . INFLUENZA VACCINE  Completed   Immunization History  Administered Date(s) Administered  . Influenza,inj,Quad PF,36+ Mos 11/27/2014, 01/19/2016  . Influenza-Unspecified 11/29/2013  . Tdap 12/12/2013   Patient Active Problem List   Diagnosis Date Noted  . Dysuria 04/18/2015  . Hyperlipidemia   . Allergic rhinitis due to pollen    Past Medical History:  Diagnosis Date  . Allergic rhinitis due to pollen   . Diverticulosis   . Hyperlipidemia    Past Surgical History:  Procedure Laterality Date  . FRACTURE SURGERY     Social History   Social History  . Marital status: Single    Spouse name: N/A  . Number of children: N/A  . Years of education: N/A   Occupational History  . software    Social History Main Topics  . Smoking status: Current Every Day Smoker    Packs/day: 0.50    Years: 20.00    Last attempt to quit: 04/08/2012  .  Smokeless tobacco: Current User     Comment: Vape  . Alcohol use No  . Drug use: No  . Sexual activity: Yes    Partners: Male   Other Topics Concern  . 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 Arthritis Mother   . Hyperlipidemia Mother   . Heart disease Mother   . Hypertension Mother   . Diabetes Mother   . Hyperlipidemia Father   . Heart disease Father   . Stroke Father   . Pulmonary embolism Sister   . Colon cancer Neg Hx   . Pancreatic cancer Neg Hx   . Stomach cancer Neg Hx    No Known Allergies  Medication list has been reviewed and updated.   General: Denies fever, chills, sweats. No significant weight loss. Eyes: Denies blurring,significant itching ENT: Denies earache, sore throat, and hoarseness. Cardiovascular: Denies chest pains, palpitations, dyspnea on exertion Respiratory: Denies cough, dyspnea at rest,wheeezing Breast: no concerns about lumps GI: + GERD GU: Denies penile discharge, ED, urinary flow / outflow problems. No STD concerns. Musculoskeletal: Denies back pain, joint pain Derm: Denies rash, itching Neuro: Denies  paresthesias, frequent falls, frequent headaches Psych: Denies depression, anxiety Endocrine: Denies cold intolerance, heat intolerance, polydipsia Heme: Denies enlarged lymph  nodes Allergy: No hayfever  Objective:   BP 136/86   Pulse 69   Temp 98.3 F (36.8 C) (Oral)   Ht 5' 8.5" (1.74 m)   Wt 217 lb 4 oz (98.5 kg)   BMI 32.55 kg/m  Ideal Body Weight: Weight in (lb) to have BMI = 25: 166.5  No exam data present  GEN: well developed, well nourished, no acute distress Eyes: conjunctiva and lids normal, PERRLA, EOMI ENT: TM clear, nares clear, oral exam WNL Neck: supple, no lymphadenopathy, no thyromegaly, no JVD Pulm: clear to auscultation and percussion,  respiratory effort normal CV: regular rate and rhythm, S1-S2, no murmur, rub or gallop, no bruits, peripheral pulses normal and symmetric, no cyanosis, clubbing, edema or varicosities GI: soft, non-tender; no hepatosplenomegaly, masses; active bowel sounds all quadrants GU: no hernia, testicular mass, penile discharge Lymph: no cervical, axillary or inguinal adenopathy MSK: gait normal, muscle tone and strength WNL, no joint swelling, effusions, discoloration, crepitus  SKIN: clear, good turgor, color WNL, no rashes, lesions, or ulcerations Neuro: normal mental status, normal strength, sensation, and motion Psych: alert; oriented to person, place and time, normally interactive and not anxious or depressed in appearance. All labs reviewed with patient.  Lipids:    Component Value Date/Time   CHOL 165 01/14/2016 0823   TRIG 179.0 (H) 01/14/2016 0823   HDL 36.40 (L) 01/14/2016 0823   LDLDIRECT 106.6 11/01/2012 1440   VLDL 35.8 01/14/2016 0823   CHOLHDL 5 01/14/2016 0823   CBC: CBC Latest Ref Rng & Units 01/14/2016 11/20/2014 12/07/2013  WBC 4.0 - 10.5 K/uL 8.3 8.3 7.8  Hemoglobin 13.0 - 17.0 g/dL 15.0 15.1 14.6  Hematocrit 39.0 - 52.0 % 42.0 44.8 43.1  Platelets 150.0 - 400.0 K/uL 275.0 239.0 660.6    Basic Metabolic Panel:    Component Value Date/Time   NA 141 01/14/2016 0823   K 4.5 01/14/2016 0823   CL 106 01/14/2016 0823   CO2 28 01/14/2016 0823   BUN 12 01/14/2016 0823   CREATININE 0.89 01/14/2016 0823   GLUCOSE 112 (H) 01/14/2016 0823   CALCIUM 9.4 01/14/2016 0823   Hepatic Function Latest Ref Rng & Units 01/14/2016 11/20/2014 12/07/2013  Total Protein 6.0 - 8.3 g/dL 6.6 6.7 6.8  Albumin 3.5 - 5.2 g/dL 4.0 4.1 3.9  AST 0 - 37 U/L _0 ALT 0 - 53 U/L _1 Alk Phosphatase 39 - 117 U/L 76 89 87  Total Bilirubin 0.2 - 1.2 mg/dL 0.4 0.8 1.0  Bilirubin, Direct 0.0 - 0.3 mg/dL 0.1 0.2 0.1    Lab Results  Component Value Date   TSH 1.33 08/01/2015   Lab Results    Component Value Date   PSA 0.90 01/14/2016   PSA 0.66 11/20/2014   PSA 0.58 12/07/2013    Assessment and Plan:   Health Maintenance Exam: The patient's preventative maintenance and recommended screening tests for an annual wellness exam were reviewed in full today. Brought up to date unless services declined.  Counselled on the importance of diet, exercise, and its role in overall health and mortality. The patient's FH and SH was reviewed, including their home life, tobacco status, and drug and alcohol status.  Follow-up in 1 year for physical exam or additional follow-up below.  Health care maintenance  Need for prophylactic vaccination and inoculation against influenza - Plan: Flu Vaccine QUAD 36+ mos IM  Work on diet, weight Stop smoking  Follow-up: No Follow-up on file.  Meds  ordered this encounter  Medications  . ranitidine (ZANTAC) 75 MG tablet    Sig: Take 75 mg by mouth daily.   Medications Discontinued During This Encounter  Medication Reason  . pantoprazole (PROTONIX) 40 MG tablet Completed Course   Orders Placed This Encounter  Procedures  . Flu Vaccine QUAD 36+ mos IM    Signed,  Kaisyn Millea T. Braylinn Gulden, MD     Medication List       Accurate as of 01/19/16  1:34 PM. Always use your most recent med list.          aspirin 81 MG tablet Take 81 mg by mouth daily.   atorvastatin 20 MG tablet Commonly known as:  LIPITOR TAKE 1 TABLET DAILY   multivitamin tablet Take 1 tablet by mouth daily.   ranitidine 75 MG tablet Commonly known as:  ZANTAC Take 75 mg by mouth daily.

## 2016-04-03 ENCOUNTER — Other Ambulatory Visit: Payer: Self-pay | Admitting: Family Medicine

## 2016-07-14 ENCOUNTER — Ambulatory Visit: Payer: Managed Care, Other (non HMO) | Admitting: Family Medicine

## 2016-07-15 ENCOUNTER — Other Ambulatory Visit: Payer: Self-pay

## 2016-07-15 ENCOUNTER — Encounter: Payer: Self-pay | Admitting: Internal Medicine

## 2016-07-15 MED ORDER — RANITIDINE HCL 150 MG PO CAPS
150.0000 mg | ORAL_CAPSULE | Freq: Every evening | ORAL | 3 refills | Status: DC
Start: 2016-07-15 — End: 2016-11-29

## 2016-07-15 MED ORDER — PANTOPRAZOLE SODIUM 40 MG PO TBEC: 40.0000 mg | DELAYED_RELEASE_TABLET | Freq: Every day | ORAL | 3 refills | Status: DC

## 2016-07-15 NOTE — Telephone Encounter (Signed)
See email chain Have pt restart pantoprazole 40 mg once daily, can use zantac 150 mg in the evening if needed for breakthrough heartburn After 1 month if any residual left sided pain, I recommend EGD

## 2016-07-26 ENCOUNTER — Ambulatory Visit: Payer: Managed Care, Other (non HMO) | Admitting: Family Medicine

## 2016-09-26 IMAGING — CT CT ABD-PELV W/ CM
2 of 5 series · 16 of 46 positions shown, 18 images · IV contrast (ISOVUE 300)
Comparison: None.

CLINICAL DATA: Left abdominal/flank pain x6 months

EXAM:
CT ABDOMEN AND PELVIS WITH CONTRAST
TECHNIQUE: Multidetector CT imaging of the abdomen and pelvis was performed
using the standard protocol following bolus administration of
intravenous contrast.
CONTRAST:  100mL BKWAKV-OYY IOPAMIDOL (BKWAKV-OYY) INJECTION 61%

[Series 2: abd/ pelvis · axial · 0.88mm/px · z∈[+895,+1340]mm · 13 of 99 slices shown, 15 images]
[im 5/99  soft-tissue]
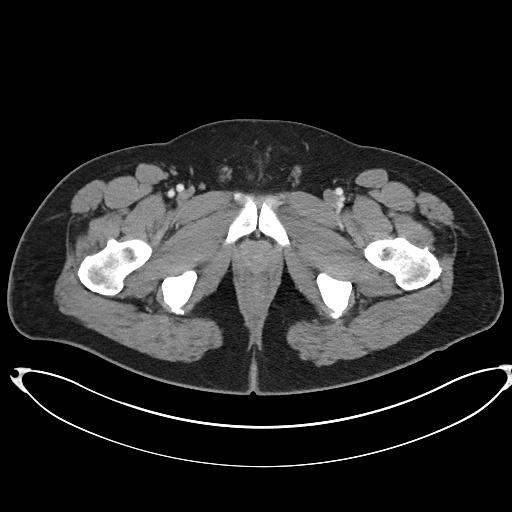
[im 5/99  bone]
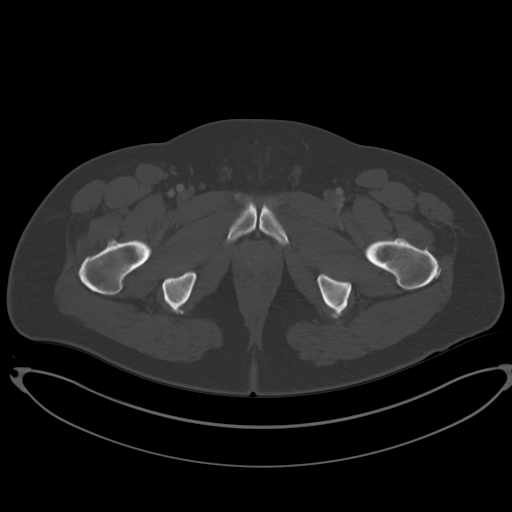
[im 15/99  soft-tissue]
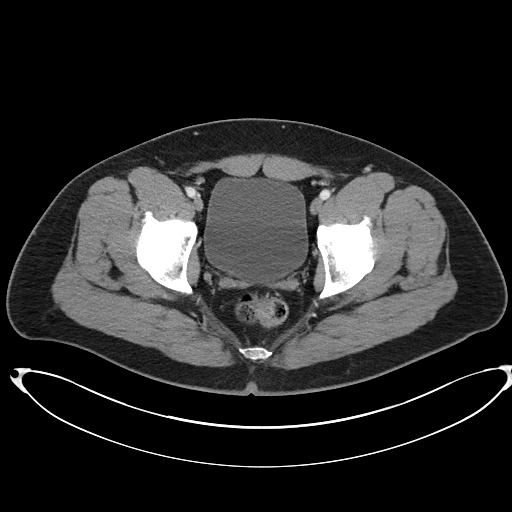
[im 20/99  soft-tissue]
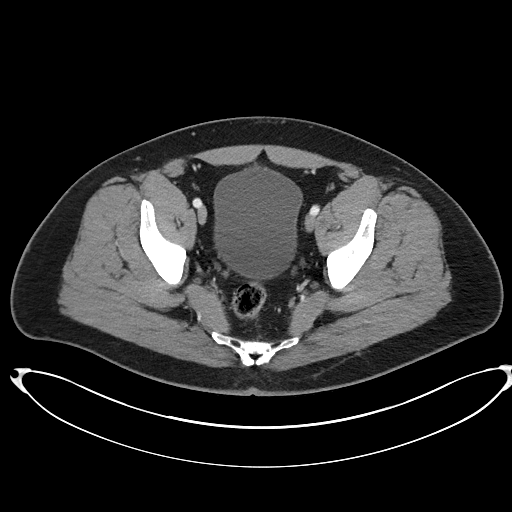
[im 30/99  soft-tissue]
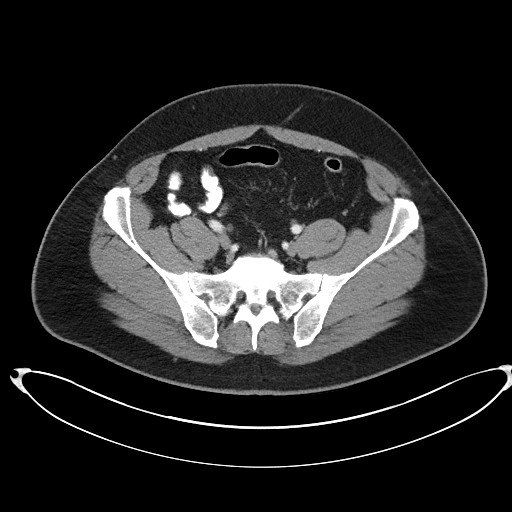
[im 35/99  soft-tissue]
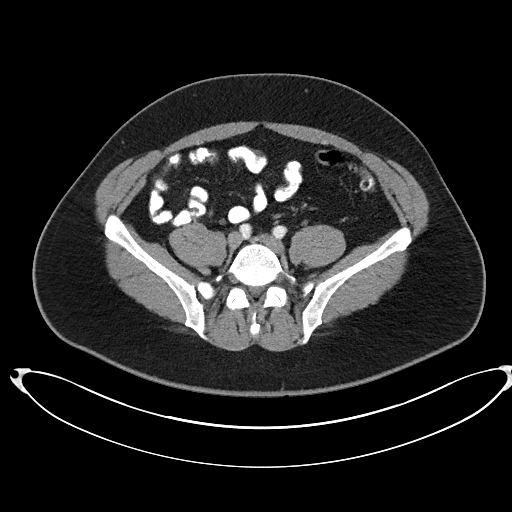
[im 45/99  soft-tissue]
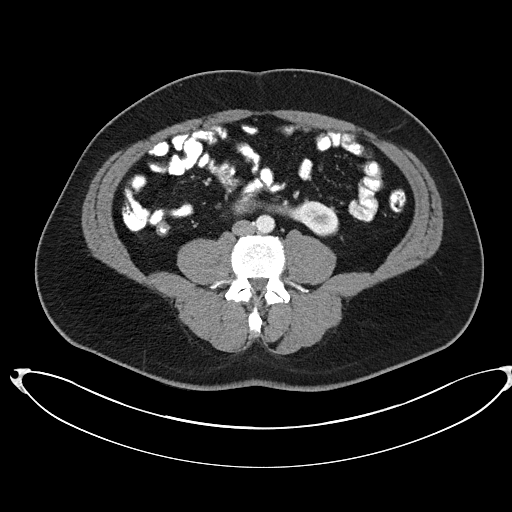
[im 50/99  soft-tissue]
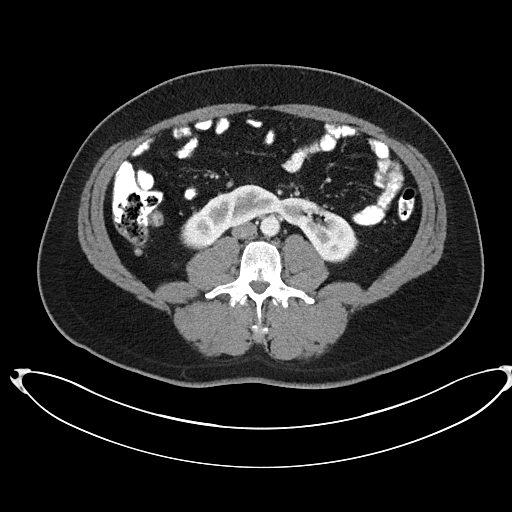
[im 54/99  soft-tissue]
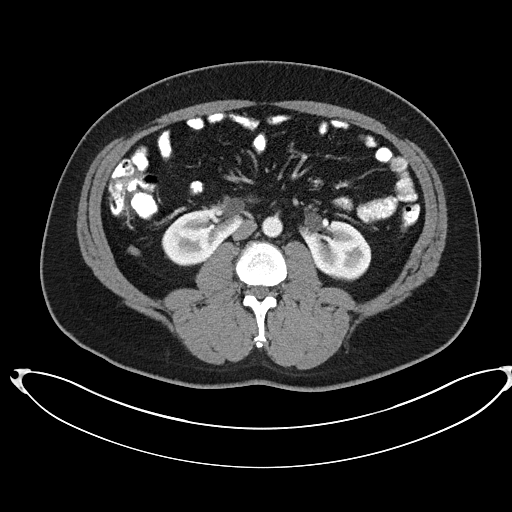
[im 64/99  soft-tissue]
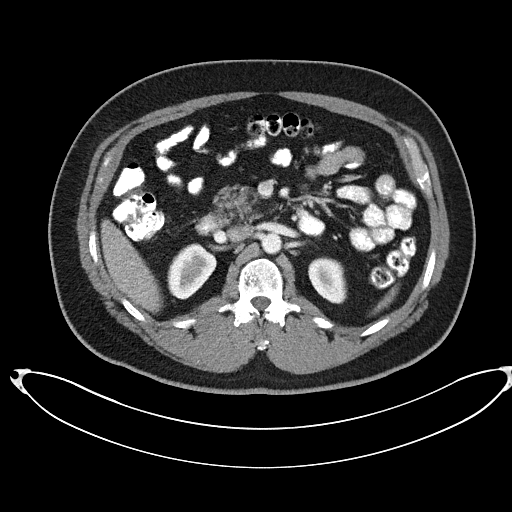
[im 64/99  bone]
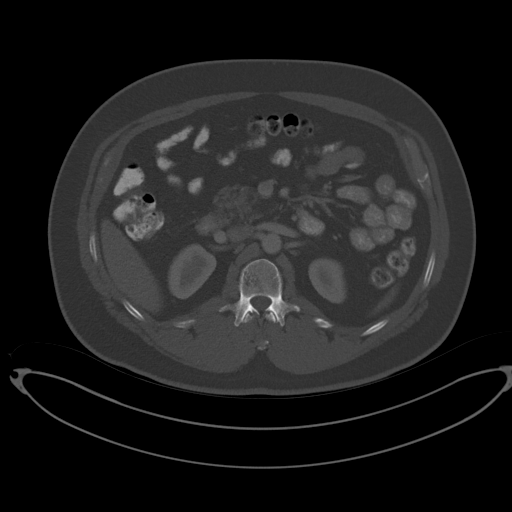
[im 69/99  soft-tissue]
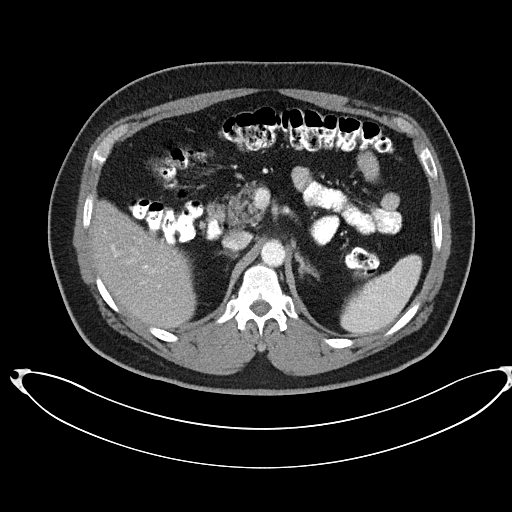
[im 79/99  soft-tissue]
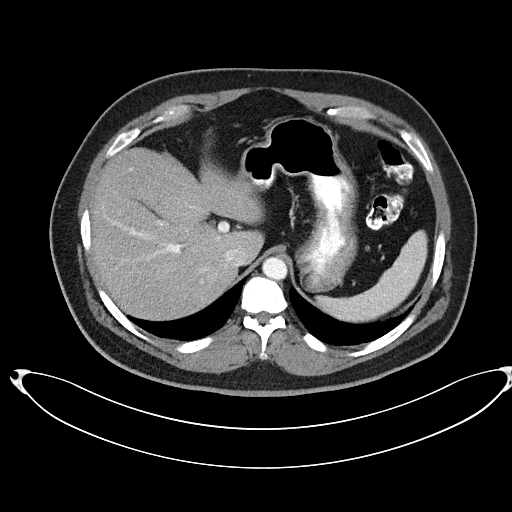
[im 84/99  soft-tissue]
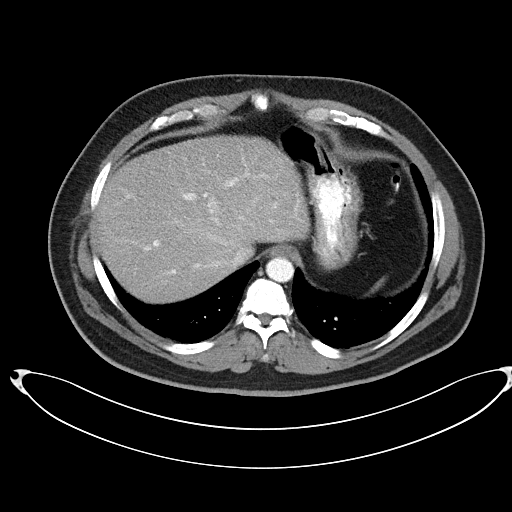
[im 94/99  soft-tissue]
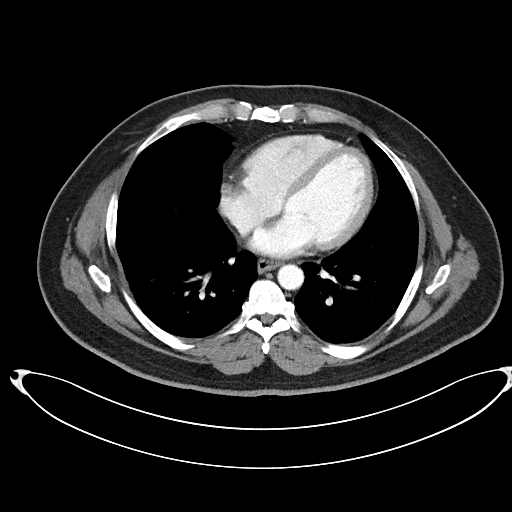

[Series 5: coronal soft tissue · coronal · 0.73mm/px · 3 of 101 slices shown]
[im 34/101  soft-tissue]
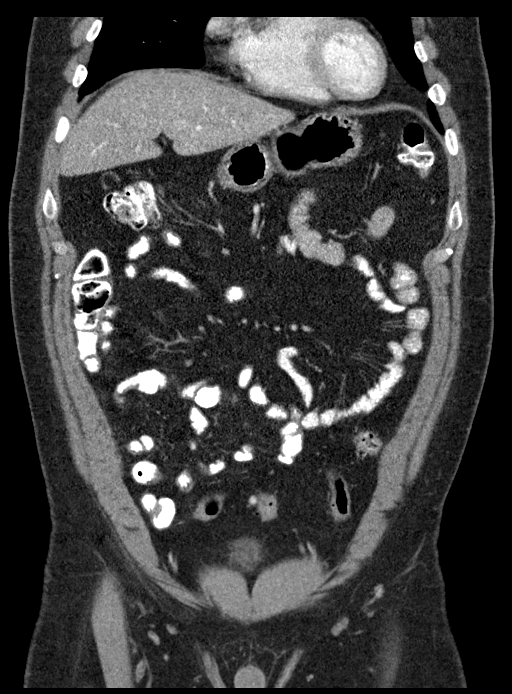
[im 45/101  soft-tissue]
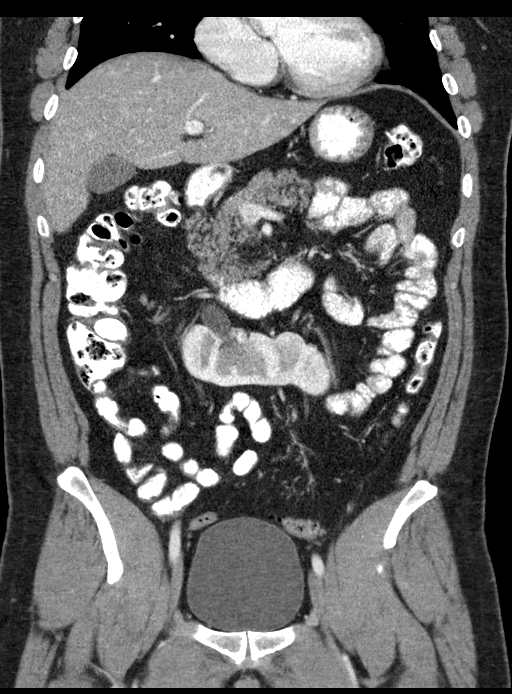
[im 56/101  soft-tissue]
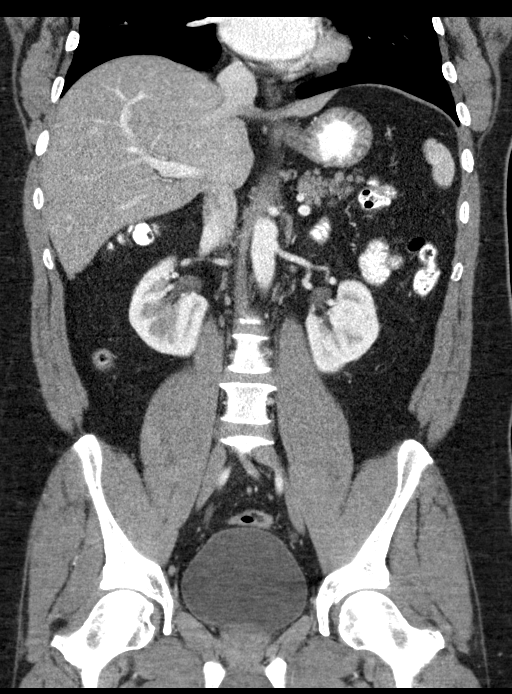

[16 of 46 positions shown; findings below may reference images not displayed]

FINDINGS: Lower chest: Mild dependent atelectasis in the bilateral lower
lobes.

Hepatobiliary: Liver is within normal limits. No
suspicious/enhancing hepatic lesions.

Gallbladder unremarkable. No intrahepatic or extrahepatic ductal
dilatation.

Pancreas: Within normal limits.

Spleen: Within normal limits.

Adrenals/Urinary Tract: Adrenal glands are within normal limits.

Horseshoe kidney.  No hydronephrosis.

Bladder is within normal limits.

Stomach/Bowel: Stomach is within normal limits.

No evidence of bowel obstruction.

Normal appendix (series 2/ image 44).

Mild sigmoid diverticulosis, without evidence of diverticulitis.

Vascular/Lymphatic: No evidence of abdominal aortic aneurysm.

No suspicious abdominopelvic lymphadenopathy.

Reproductive: Prostate is unremarkable, noting dystrophic
calcifications.

Other: No abdominopelvic ascites.

Musculoskeletal: Mild degenerative changes of the visualized
thoracolumbar spine.

Left lower chest wall/ ribs are within normal limits on CT.
IMPRESSION: No evidence of bowel obstruction.  Normal appendix.

Horseshoe kidney.

Mild sigmoid diverticulosis, without evidence of diverticulitis.

Left lower chest wall/ribs are within normal limits on CT.

## 2016-10-07 ENCOUNTER — Telehealth: Payer: Self-pay | Admitting: Internal Medicine

## 2016-10-07 NOTE — Telephone Encounter (Signed)
He has had prior CT scan of the abd/pelvis to eval this symptoms. Is he referring to an MRI of the abd, chest, thoracic spine??

## 2016-10-07 NOTE — Telephone Encounter (Signed)
Pt states his gerd seems to be controlled with his medication. Pt states he is still having pain on the left side of his chest just below his rib cage. Pt says he was told the next step would be an EGD but he has lots of anxiety about having an EGD. Pt is asking if he can have an MRI instead. Please advise.

## 2016-10-07 NOTE — Telephone Encounter (Signed)
Pt aware. Pt states he wants an MRI that will evaluate the pain in his left side that is below his rib cage. Please advise.

## 2016-10-07 NOTE — Telephone Encounter (Signed)
Ok for MRI abd/pelvis with contrast, persistent LUQ pain

## 2016-10-08 ENCOUNTER — Other Ambulatory Visit: Payer: Self-pay

## 2016-10-08 DIAGNOSIS — R1012 Left upper quadrant pain: Secondary | ICD-10-CM

## 2016-10-08 NOTE — Telephone Encounter (Signed)
Pt scheduled for MRI of A/P at [facility] 10/14/16@7am , pt to arrive there at 6:45am. Pt to be NPO after midnight. Pt aware of appt.

## 2016-10-12 ENCOUNTER — Telehealth: Payer: Self-pay

## 2016-10-12 NOTE — Telephone Encounter (Signed)
-----   Message from Darden Dates sent at 10/12/2016 10:17 AM EDT ----- Regarding: RE: MRI abd and pelvis Yes maam you will need to cancel.  Did you get the email from me? Denial you can print and give to Dr. Hilarie Fredrickson. ----- Message ----- From: Algernon Huxley, RN Sent: 10/12/2016  10:10 AM To: Darden Dates Subject: RE: MRI abd and pelvis                         So do I need to cancel this MRI? Dr. Hilarie Fredrickson is out of town until Monday. ----- Message ----- From: Darden Dates Sent: 10/12/2016  10:02 AM To: Algernon Huxley, RN Subject: RE: MRI abd and pelvis                         [employer] does precert processing for [employer] ----- Message ----- From: Algernon Huxley, RN Sent: 10/12/2016   9:45 AM To: Darden Dates Subject: RE: MRI abd and pelvis                         I just spoke to the pt and he stated that he has [facility] he does not know what [employer] is.   ----- Message ----- From: Darden Dates Sent: 10/12/2016   8:12 AM To: Algernon Huxley, RN Subject: MRI abd and pelvis                             Good morning Vaughan Basta, I got a fax from [facility] saying this pt's MRI abd and pelvis have been denied.  He is scheduled for 10/14/16.  I have faxed all records and they have still denied. I will email you the denial so you can get it to Dr. Hilarie Fredrickson. Thank you, Amy

## 2016-10-12 NOTE — Telephone Encounter (Signed)
MRI appt cancelled and Dr. Hilarie Fredrickson aware.

## 2016-10-14 ENCOUNTER — Ambulatory Visit (HOSPITAL_COMMUNITY): Admission: RE | Admit: 2016-10-14 | Payer: Managed Care, Other (non HMO) | Source: Ambulatory Visit

## 2016-10-14 ENCOUNTER — Inpatient Hospital Stay (HOSPITAL_COMMUNITY): Admission: RE | Admit: 2016-10-14 | Payer: Self-pay | Source: Ambulatory Visit

## 2016-10-18 NOTE — Telephone Encounter (Signed)
Patient had contacted me for MRI abdomen for ongoing issues with upper abdominal pain Please let patient know this was denied by his insurance I recommend he follow-up in the office to discuss his symptoms further given imaging order (MRI) was denied by his insurance

## 2016-10-18 NOTE — Telephone Encounter (Signed)
Left message for patient to call back  

## 2016-10-19 NOTE — Telephone Encounter (Signed)
Left message for pt to call back  °

## 2016-10-19 NOTE — Telephone Encounter (Signed)
Pt scheduled to see Dr. Hilarie Fredrickson 12/20/16@9am . Pt aware of appt.

## 2016-10-21 ENCOUNTER — Encounter: Payer: Managed Care, Other (non HMO) | Admitting: Family Medicine

## 2016-11-13 ENCOUNTER — Emergency Department (HOSPITAL_COMMUNITY)
Admission: EM | Admit: 2016-11-13 | Discharge: 2016-11-13 | Disposition: A | Discharge status: Home / Self Care | Payer: 59 | Attending: Emergency Medicine | Admitting: Emergency Medicine

## 2016-11-13 ENCOUNTER — Encounter (HOSPITAL_COMMUNITY): Payer: Self-pay | Admitting: *Deleted

## 2016-11-13 ENCOUNTER — Emergency Department (HOSPITAL_COMMUNITY): Payer: 59

## 2016-11-13 DIAGNOSIS — R079 Chest pain, unspecified: Secondary | ICD-10-CM | POA: Diagnosis present

## 2016-11-13 DIAGNOSIS — Z5321 Procedure and treatment not carried out due to patient leaving prior to being seen by health care provider: Secondary | ICD-10-CM | POA: Insufficient documentation

## 2016-11-13 LAB — BASIC METABOLIC PANEL
Anion gap: 8 (ref 5–15)
BUN: 11 mg/dL (ref 6–20)
CO2: 25 mmol/L (ref 22–32)
CREATININE: 1 mg/dL (ref 0.61–1.24)
Calcium: 9 mg/dL (ref 8.9–10.3)
Chloride: 104 mmol/L (ref 101–111)
GFR calc Af Amer: >60 mL/min (ref >60)
GFR calc non Af Amer: >60 mL/min (ref >60)
GLUCOSE: 132 mg/dL — AB (ref 65–99)
POTASSIUM: 3.8 mmol/L (ref 3.5–5.1)
Sodium: 137 mmol/L (ref 135–145)

## 2016-11-13 LAB — TROPONIN I: Troponin I: <0.03 ng/mL (ref <0.03)

## 2016-11-13 LAB — CBC
HEMATOCRIT: 41.7 % (ref 39.0–52.0)
Hemoglobin: 14.4 g/dL (ref 13.0–17.0)
MCH: 29.7 pg (ref 26.0–34.0)
MCHC: 34.5 g/dL (ref 30.0–36.0)
MCV: 86 fL (ref 78.0–100.0)
PLATELETS: 280 10*3/uL (ref 150–400)
RBC: 4.85 MIL/uL (ref 4.22–5.81)
RDW: 12.9 % (ref 11.5–15.5)
WBC: 9.6 10*3/uL (ref 4.0–10.5)

## 2016-11-13 NOTE — ED Notes (Signed)
Pt states that he feels much better and has to go home, pt left the lobby after encouragement to stay.

## 2016-11-13 NOTE — ED Triage Notes (Signed)
The pt woke up this am with chest pain and sob.  No previous history no cardiac problems   Worse indigestion over the past year  And he cannot seem to get rid of it

## 2016-11-19 ENCOUNTER — Ambulatory Visit (INDEPENDENT_AMBULATORY_CARE_PROVIDER_SITE_OTHER): Payer: 59 | Admitting: Psychology

## 2016-11-19 DIAGNOSIS — F411 Generalized anxiety disorder: Secondary | ICD-10-CM

## 2016-11-29 ENCOUNTER — Ambulatory Visit (INDEPENDENT_AMBULATORY_CARE_PROVIDER_SITE_OTHER): Payer: Self-pay | Admitting: Family Medicine

## 2016-11-29 ENCOUNTER — Encounter: Payer: Self-pay | Admitting: Family Medicine

## 2016-11-29 VITALS — BP 140/90 | HR 74 | Temp 97.9°F | Ht 68.5 in | Wt 218.8 lb

## 2016-11-29 DIAGNOSIS — I10 Essential (primary) hypertension: Secondary | ICD-10-CM

## 2016-11-29 DIAGNOSIS — F410 Panic disorder [episodic paroxysmal anxiety] without agoraphobia: Secondary | ICD-10-CM

## 2016-11-29 DIAGNOSIS — Z114 Encounter for screening for human immunodeficiency virus [HIV]: Secondary | ICD-10-CM

## 2016-11-29 DIAGNOSIS — Z1159 Encounter for screening for other viral diseases: Secondary | ICD-10-CM

## 2016-11-29 MED ORDER — LORAZEPAM 0.5 MG PO TABS
0.5000 mg | ORAL_TABLET | Freq: Three times a day (TID) | ORAL | 1 refills | Status: DC | PRN
Start: 2016-11-29 — End: 2018-04-19

## 2016-11-29 MED ORDER — HYDROCHLOROTHIAZIDE 12.5 MG PO TABS: 12.5000 mg | ORAL_TABLET | Freq: Every day | ORAL | 3 refills | Status: DC

## 2016-11-29 NOTE — Progress Notes (Signed)
Dr. Frederico Hamman T. Danyl Deems, MD, [facility] [place], [address] Phone: [phone] Fax: [phone]  11/29/2016  Patient: Willie Johnson, MRN: [id number], DOB: [birth year], 56 y.o.  Primary Physician:  Owens Loffler, MD   Chief Complaint  Patient presents with  . Hypertension   Subjective:   Willie Johnson is a 56 y.o. very pleasant male patient who presents with the following:  BP Readings from Last 3 Encounters:  11/29/16 140/90  11/13/16 (!) 150/95  01/19/16 136/86    Wt Readings from Last 3 Encounters:  11/29/16 218 lb 12 oz (99.2 kg)  11/13/16 214 lb (97.1 kg)  01/19/16 217 lb 4 oz (98.5 kg)    [name] is here today with some blood pressure readings, which have been high.  He does have a cuff at home and he is been taking his blood pressure routinely.  He has readings that are anywhere from 140 to the 150s region and some in the 66Z and 993T diastolic.  He has never had a diagnosis of hypertension before.  He has been stressed out some, and he has not been exercising as much as he would like to  Past Medical History, Surgical History, Social History, Family History, Problem List, Medications, and Allergies have been reviewed and updated if relevant.  Patient Active Problem List   Diagnosis Date Noted  . Dysuria 04/18/2015  . Hyperlipidemia   . Allergic rhinitis due to pollen     Past Medical History:  Diagnosis Date  . Allergic rhinitis due to pollen   . Diverticulosis   . Hyperlipidemia     Past Surgical History:  Procedure Laterality Date  . FRACTURE SURGERY      Social History   Social History  . Marital status: Single    Spouse name: N/A  . Number of children: N/A  . Years of education: N/A   Occupational History  . software    Social History Main Topics  . Smoking status: Current Every Day Smoker    Packs/day: 0.50    Years: 20.00    Last attempt to quit: 04/08/2012  . Smokeless  tobacco: Current User     Comment: Vape  . Alcohol use No  . Drug use: No  . Sexual activity: Yes    Partners: Male   Other Topics Concern  . 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 Arthritis Mother   . Hyperlipidemia Mother   . Heart disease Mother   . Hypertension Mother   . Diabetes Mother   . Hyperlipidemia Father   . Heart disease Father   . Stroke Father   . Pulmonary embolism Sister   . Colon cancer Neg Hx   . Pancreatic cancer Neg Hx   . Stomach cancer Neg Hx     No Known Allergies  Medication list reviewed and updated in full in [place].   GEN: No acute illnesses, no fevers, chills. GI: No n/v/d, eating normally Pulm: No SOB Interactive and getting along well at home.  Otherwise, ROS is as per the HPI.  Objective:   BP 140/90   Pulse 74   Temp 97.9 F (36.6 C) (Oral)   Ht 5' 8.5" (1.74 m)   Wt 218  lb 12 oz (99.2 kg)   BMI 32.78 kg/m   GEN: WDWN, NAD, Non-toxic, A & O x 3 HEENT: Atraumatic, Normocephalic. Neck supple. No masses, No LAD. Ears and Nose: No external deformity. CV: RRR, No M/G/R. No JVD. No thrill. No extra heart sounds. PULM: CTA B, no wheezes, crackles, rhonchi. No retractions. No resp. distress. No accessory muscle use. EXTR: No c/c/e NEURO Normal gait.  PSYCH: Normally interactive. Conversant. Not depressed or anxious appearing.  Calm demeanor.   Laboratory and Imaging Data:  Assessment and Plan:   Essential hypertension  Encounter for hepatitis C screening test for low risk patient - Plan: Hepatitis C antibody  Screening for HIV (human immunodeficiency virus) - Plan: HIV antibody  Panic attacks   Start low-dose hydrochlorothiazide.  Additionally has been having some occasional panic attacks, but relatively uncommon.  He is started doing some  counseling with Dr. Cheryln Manly.  I'm going to give him some emergency Ativan to use as needed.  Follow-up: 6 mo  Future Appointments Date Time Provider [place]  12/20/2016 9:00 AM Pyrtle, Lajuan Lines, MD [facility]  02/10/2017 8:30 AM Orvil Faraone, Frederico Hamman, MD [facility]    Meds ordered this encounter  Medications  . LORazepam (ATIVAN) 0.5 MG tablet    Sig: Take 1 tablet (0.5 mg total) by mouth every 8 (eight) hours as needed for anxiety.    Dispense:  10 tablet    Refill:  1  . hydrochlorothiazide (HYDRODIURIL) 12.5 MG tablet    Sig: Take 1 tablet (12.5 mg total) by mouth daily.    Dispense:  90 tablet    Refill:  3   Medications Discontinued During This Encounter  Medication Reason  . ranitidine (ZANTAC) 150 MG capsule Change in therapy   Orders Placed This Encounter  Procedures  . Hepatitis C antibody  . HIV antibody    Signed,  Frederico Hamman T. Yoshiaki Kreuser, MD   Patient's Medications  New Prescriptions   HYDROCHLOROTHIAZIDE (HYDRODIURIL) 12.5 MG TABLET    Take 1 tablet (12.5 mg total) by mouth daily.   LORAZEPAM (ATIVAN) 0.5 MG TABLET    Take 1 tablet (0.5 mg total) by mouth every 8 (eight) hours as needed for anxiety.  Previous Medications   ASPIRIN 81 MG TABLET    Take 81 mg by mouth daily.   ATORVASTATIN (LIPITOR) 20 MG TABLET    TAKE 1 TABLET DAILY. PLEASEMAKE AN APPOINTMENT WITH   DR.Shaurya Rawdon   MULTIPLE VITAMIN (MULTIVITAMIN) TABLET    Take 1 tablet by mouth daily.   PANTOPRAZOLE (PROTONIX) 40 MG TABLET    Take 1 tablet (40 mg total) by mouth daily.   RANITIDINE (ZANTAC) 75 MG TABLET    Take 75 mg by mouth 2 (two) times daily.   Modified Medications   No medications on file  Discontinued Medications   RANITIDINE (ZANTAC) 150 MG CAPSULE    Take 1 capsule (150 mg total) by mouth every evening.

## 2016-11-30 DIAGNOSIS — I10 Essential (primary) hypertension: Secondary | ICD-10-CM | POA: Insufficient documentation

## 2016-11-30 DIAGNOSIS — F410 Panic disorder [episodic paroxysmal anxiety] without agoraphobia: Secondary | ICD-10-CM | POA: Insufficient documentation

## 2016-11-30 LAB — HIV ANTIBODY (ROUTINE TESTING W REFLEX): HIV: NONREACTIVE

## 2016-11-30 LAB — HEPATITIS C ANTIBODY
HEP C AB: NONREACTIVE
SIGNAL TO CUT-OFF: 0.09 (ref <1.00)

## 2016-12-03 ENCOUNTER — Encounter: Payer: Self-pay | Admitting: *Deleted

## 2016-12-20 ENCOUNTER — Encounter: Payer: Self-pay | Admitting: Internal Medicine

## 2016-12-20 ENCOUNTER — Ambulatory Visit (INDEPENDENT_AMBULATORY_CARE_PROVIDER_SITE_OTHER): Payer: Self-pay | Admitting: Internal Medicine

## 2016-12-20 VITALS — BP 130/82 | HR 74 | Ht 69.0 in | Wt 217.2 lb

## 2016-12-20 DIAGNOSIS — R1013 Epigastric pain: Secondary | ICD-10-CM

## 2016-12-20 DIAGNOSIS — R1012 Left upper quadrant pain: Secondary | ICD-10-CM

## 2016-12-20 NOTE — Progress Notes (Signed)
Subjective:    Patient ID: Willie Johnson, male    DOB: 09-04-1960, 56 y.o.   MRN: [id number]  HPI Willie Johnson is a 56 year old male with a past medical history of GERD, dyspepsia and left upper quadrant pain who is here for follow-up. He was last seen in the office in May 2017. He is here alone today  After his last visit a CT scan was performed in July 2017 to evaluate his left upper quadrant abdominal pain. This showed no evidence of bowel obstruction. 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 There is mild sigmoid diverticulosis without diverticulitis. The left lower chest wall/ribs were within normal limits by CT.  He reports that several months ago he developed again left upper quadrant pain along with a feeling of gas and bloating. He has not had much in the way of heartburn or indigestion. He went back on the Zantac at 150 mg twice a day which he took for several months. This caused the pain to resolve entirely. He's been off of Zantac for 2 weeks and has not had return of the symptoms. He is eating well without nausea or vomiting. There is no dysphagia or odynophagia. Bowel movements a been regular without diarrhea, constipation, blood in stool or melena. His weight has been stable. He does report significant decrease stress over the last several months and he thinks this is also caused the improvement in his symptoms. He discussed this with his primary doctor who felt like he may have some costochondritis or floating rib pain on the left. He does report occasionally he can feel soreness near his left inferior most rib with lifting heavy objects.  Review of Systems As per history of present illness, otherwise negative  Current Medications, Allergies, Past Medical History, Past Surgical History, Family History and Social History were reviewed in [employer] record.     Objective:   Physical Exam BP 130/82   Pulse 74   Ht 5\' 9"  (1.753 m)   Wt 217 lb 4 oz (98.5 kg)    BMI 32.08 kg/m  Constitutional: Well-developed and well-nourished. No distress. HEENT: Normocephalic and atraumatic. Conjunctivae are normal.  No scleral icterus. Neck: Neck supple. Trachea midline. Cardiovascular: Normal rate, regular rhythm and intact distal pulses. No M/R/G Pulmonary/chest: Effort normal and breath sounds normal. No wheezing, rales or rhonchi. Abdominal: Soft, nontender, nondistended. Bowel sounds active throughout. There are no masses palpable. No hepatosplenomegaly.  Some tenderness with palpation in mid-axillary line with palpation of rib cage Extremities: no clubbing, cyanosis, or edema Neurological: Alert and oriented to person place and time. Skin: Skin is warm and dry. Psychiatric: Normal mood and affect. Behavior is normal.  CBC    Component Value Date/Time   WBC 9.6 11/13/2016 1722   RBC 4.85 11/13/2016 1722   HGB 14.4 11/13/2016 1722   HCT 41.7 11/13/2016 1722   PLT 280 11/13/2016 1722   MCV 86.0 11/13/2016 1722   MCH 29.7 11/13/2016 1722   MCHC 34.5 11/13/2016 1722   RDW 12.9 11/13/2016 1722   LYMPHSABS 2.2 01/14/2016 0823   MONOABS 0.6 01/14/2016 0823   EOSABS 0.3 01/14/2016 0823   BASOSABS 0.0 01/14/2016 0823   CMP     Component Value Date/Time   NA 137 11/13/2016 1722   K 3.8 11/13/2016 1722   CL 104 11/13/2016 1722   CO2 25 11/13/2016 1722   GLUCOSE 132 (H) 11/13/2016 1722   BUN 11 11/13/2016 1722   CREATININE 1.00 11/13/2016 1722  CALCIUM 9.0 11/13/2016 1722   PROT 6.6 01/14/2016 0823   ALBUMIN 4.0 01/14/2016 0823   AST 14 01/14/2016 0823   ALT 29 01/14/2016 0823   ALKPHOS 76 01/14/2016 0823   BILITOT 0.4 01/14/2016 0823   GFRNONAA >60 11/13/2016 1722   GFRAA >60 11/13/2016 1722     CT ABDOMEN AND PELVIS WITH CONTRAST TECHNIQUE: Multidetector CT imaging of the abdomen and pelvis was performed using the standard protocol following bolus administration of intravenous contrast. CONTRAST:  171mL ISOVUE-300 IOPAMIDOL  (ISOVUE-300) INJECTION 61% COMPARISON:  None. FINDINGS: Lower chest: Mild dependent atelectasis in the bilateral lower lobes. Hepatobiliary: Liver is within normal limits. No suspicious/enhancing hepatic lesions. Gallbladder unremarkable. No intrahepatic or extrahepatic ductal dilatation. Pancreas: Within normal limits. Spleen: Within normal limits. Adrenals/Urinary Tract: Adrenal glands are within normal limits. Horseshoe kidney.  No hydronephrosis. Bladder is within normal limits. Stomach/Bowel: Stomach is within normal limits. No evidence of bowel obstruction. Normal appendix (series 2/ image 44). Mild sigmoid diverticulosis, without evidence of diverticulitis. Vascular/Lymphatic: No evidence of abdominal aortic aneurysm. No suspicious abdominopelvic lymphadenopathy. Reproductive: Prostate is unremarkable, noting dystrophic calcifications. Other: No abdominopelvic ascites. Musculoskeletal: Mild degenerative changes of the visualized thoracolumbar spine. Left lower chest wall/ ribs are within normal limits on CT. IMPRESSION: No evidence of bowel obstruction.  Normal appendix. Horseshoe kidney. Mild sigmoid diverticulosis, without evidence of diverticulitis. Left lower chest wall/ribs are within normal limits on CT. Electronically Signed   By: Julian Hy M.D.   On: 09/30/2015 14:22     Assessment & Plan:  57 year old male with a past medical history of GERD, dyspepsia and left upper quadrant pain who is here for follow-up  1. LUQ pain/dyspepsia -- His left upper quadrant discomfort improves dramatically with Zantac twice a day. This argues strongly for dyspepsia.  Currently he is feeling well and has no left upper quadrant discomfort. Given lack of symptoms currently and stability over time I do not think upper endoscopy is warranted currently. He can use Zantac 150 mg twice a day on an as-needed basis. He also may have a component of left lower rib pain and possible  intermittent costochondritis.  We did discuss that Zantac would not treat costochondritis and given response to Zantac dyspepsia is felt most likely.  He can follow-up on an as-needed basis 15 minutes spent with the patient today. Greater than 50% was spent in counseling and coordination of care with the patient

## 2016-12-20 NOTE — Patient Instructions (Signed)
Please follow up as needed with Dr Hilarie Fredrickson  If you are age 54 or older, your body mass index should be between 23-30. Your Body mass index is 32.08 kg/m. If this is out of the aforementioned range listed, please consider follow up with your Primary Care Provider.  If you are age 26 or younger, your body mass index should be between 19-25. Your Body mass index is 32.08 kg/m. If this is out of the aformentioned range listed, please consider follow up with your Primary Care Provider.

## 2017-01-03 ENCOUNTER — Telehealth: Payer: Self-pay | Admitting: Family Medicine

## 2017-01-03 MED ORDER — LISINOPRIL-HYDROCHLOROTHIAZIDE 10-12.5 MG PO TABS: 1.0000 | ORAL_TABLET | Freq: Every day | ORAL | 3 refills | Status: DC

## 2017-01-03 NOTE — Telephone Encounter (Signed)
Ok to change to   HCTZ / lisinopril combo, 12.5 / 10 mg. 1 po daily, #30, 3 ref  Electronically Signed  By: Owens Loffler, MD On: 01/03/2017 11:32 AM     Copied from [place]. Topic: Inquiry >> Jan 03, 2017 11:21 AM Neva Seat wrote: Pt says current Rx Hydrochlorothiziade 12.5mg   not really lowering BP (General reading...130/90).  He is requesting higher doseage to lower BP

## 2017-01-03 NOTE — Telephone Encounter (Signed)
Mr. Willie Johnson notified as instructed by telephone.  Lisinopril/HCTZ Rx sent to [facility] on [address]

## 2017-01-03 NOTE — Telephone Encounter (Signed)
*  below. 

## 2017-01-03 NOTE — Telephone Encounter (Signed)
Copied from [place]. >> Jan 03, 2017 11:21 AM Neva Seat wrote: Pt says current Rx Hydrochlorothiziade 12.5mg   not really lowering BP (General reading...130/90).  He is requesting higher doseage to lower BP

## 2017-01-12 ENCOUNTER — Telehealth: Payer: Self-pay | Admitting: Family Medicine

## 2017-01-12 MED ORDER — LOSARTAN POTASSIUM 50 MG PO TABS: 50.0000 mg | ORAL_TABLET | Freq: Every day | ORAL | 3 refills | Status: DC

## 2017-01-12 NOTE — Telephone Encounter (Signed)
Willie Johnson notified as instructed by telephone.  Losartan 50 mg Rx sent to [facility] on [address] as instructed by Dr. Lorelei Pont.  Medication list updated.

## 2017-01-12 NOTE — Telephone Encounter (Signed)
D/c lisinopril / hctz  Cough is a common side effect for ACE inhibitors. Benazapril is in the same class of medications.   We should try something in in a different class of medication.  After stopping above, start losartan 50 mg. 1 po daily, #30, 3 ref.   Let me know if BP persistently > 140/90 after making change. Give at least a week.

## 2017-01-12 NOTE — Telephone Encounter (Signed)
Copied from [place] [id number]. Topic: Quick Communication - See Telephone Encounter >> Jan 12, 2017  8:06 AM Arletha Grippe wrote: CRM for notification. See Telephone encounter for:   01/12/17. Pt is having side effects with lisinopril-hydrochlorothiazide (PRINZIDE,ZESTORETIC) 10-12.5 MG tablet . He is having coughing and trouble sleeping. He would like to try Benazepril.   Cb [phone] [facility]  [id number] [place], [place] - [address]

## 2017-02-02 ENCOUNTER — Other Ambulatory Visit: Payer: Self-pay | Admitting: Family Medicine

## 2017-02-02 DIAGNOSIS — Z0000 Encounter for general adult medical examination without abnormal findings: Secondary | ICD-10-CM

## 2017-02-04 ENCOUNTER — Other Ambulatory Visit (INDEPENDENT_AMBULATORY_CARE_PROVIDER_SITE_OTHER): Payer: 59

## 2017-02-04 DIAGNOSIS — Z0000 Encounter for general adult medical examination without abnormal findings: Secondary | ICD-10-CM | POA: Diagnosis not present

## 2017-02-04 LAB — CBC WITH DIFFERENTIAL/PLATELET
Basophils Absolute: 0.1 10*3/uL (ref 0.0–0.1)
Basophils Relative: 0.8 % (ref 0.0–3.0)
EOS PCT: 3.1 % (ref 0.0–5.0)
Eosinophils Absolute: 0.2 10*3/uL (ref 0.0–0.7)
HCT: 43.7 % (ref 39.0–52.0)
HEMOGLOBIN: 14.6 g/dL (ref 13.0–17.0)
Lymphocytes Relative: 27.4 % (ref 12.0–46.0)
Lymphs Abs: 2.1 10*3/uL (ref 0.7–4.0)
MCHC: 33.3 g/dL (ref 30.0–36.0)
MCV: 88.5 fl (ref 78.0–100.0)
MONO ABS: 0.6 10*3/uL (ref 0.1–1.0)
MONOS PCT: 7.5 % (ref 3.0–12.0)
Neutro Abs: 4.7 10*3/uL (ref 1.4–7.7)
Neutrophils Relative %: 61.2 % (ref 43.0–77.0)
Platelets: 283 10*3/uL (ref 150.0–400.0)
RBC: 4.94 Mil/uL (ref 4.22–5.81)
RDW: 14.1 % (ref 11.5–15.5)
WBC: 7.7 10*3/uL (ref 4.0–10.5)

## 2017-02-04 LAB — BASIC METABOLIC PANEL
BUN: 14 mg/dL (ref 6–23)
CALCIUM: 9.6 mg/dL (ref 8.4–10.5)
CHLORIDE: 105 meq/L (ref 96–112)
CO2: 29 meq/L (ref 19–32)
Creatinine, Ser: 1.05 mg/dL (ref 0.40–1.50)
GFR: 77.98 mL/min (ref >60.00)
GLUCOSE: 113 mg/dL — AB (ref 70–99)
Potassium: 4.4 mEq/L (ref 3.5–5.1)
SODIUM: 139 meq/L (ref 135–145)

## 2017-02-04 LAB — LIPID PANEL
Cholesterol: 170 mg/dL (ref 0–200)
HDL: 31.2 mg/dL — AB (ref >39.00)
NonHDL: 139.16
Total CHOL/HDL Ratio: 5
Triglycerides: 218 mg/dL — ABNORMAL HIGH (ref 0.0–149.0)
VLDL: 43.6 mg/dL — ABNORMAL HIGH (ref 0.0–40.0)

## 2017-02-04 LAB — LDL CHOLESTEROL, DIRECT: LDL DIRECT: 111 mg/dL

## 2017-02-04 LAB — PSA: PSA: 0.66 ng/mL (ref 0.10–4.00)

## 2017-02-04 LAB — HEPATIC FUNCTION PANEL
ALBUMIN: 4.1 g/dL (ref 3.5–5.2)
ALK PHOS: 85 U/L (ref 39–117)
ALT: 27 U/L (ref 0–53)
AST: 16 U/L (ref 0–37)
Bilirubin, Direct: 0.1 mg/dL (ref 0.0–0.3)
TOTAL PROTEIN: 6.7 g/dL (ref 6.0–8.3)
Total Bilirubin: 0.6 mg/dL (ref 0.2–1.2)

## 2017-02-10 ENCOUNTER — Other Ambulatory Visit: Payer: Self-pay

## 2017-02-10 ENCOUNTER — Ambulatory Visit (INDEPENDENT_AMBULATORY_CARE_PROVIDER_SITE_OTHER): Payer: 59 | Admitting: Family Medicine

## 2017-02-10 ENCOUNTER — Encounter: Payer: Self-pay | Admitting: Family Medicine

## 2017-02-10 VITALS — BP 120/72 | HR 67 | Temp 98.3°F | Ht 69.0 in | Wt 218.8 lb

## 2017-02-10 DIAGNOSIS — R0609 Other forms of dyspnea: Secondary | ICD-10-CM | POA: Diagnosis not present

## 2017-02-10 DIAGNOSIS — Z8249 Family history of ischemic heart disease and other diseases of the circulatory system: Secondary | ICD-10-CM | POA: Diagnosis not present

## 2017-02-10 DIAGNOSIS — Z0000 Encounter for general adult medical examination without abnormal findings: Secondary | ICD-10-CM | POA: Diagnosis not present

## 2017-02-10 DIAGNOSIS — I10 Essential (primary) hypertension: Secondary | ICD-10-CM | POA: Diagnosis not present

## 2017-02-10 DIAGNOSIS — E7800 Pure hypercholesterolemia, unspecified: Secondary | ICD-10-CM | POA: Diagnosis not present

## 2017-02-10 MED ORDER — AMOXICILLIN-POT CLAVULANATE 875-125 MG PO TABS: 1.0000 | ORAL_TABLET | Freq: Two times a day (BID) | ORAL | 0 refills | Status: AC

## 2017-02-10 NOTE — Progress Notes (Addendum)
Dr. Frederico Hamman T. Yee Joss, MD, [facility] [place], [address] Phone: [phone] Fax: [phone]  02/10/2017  Patient: Willie Johnson, MRN: [id number], DOB: Aug 19, 1960, 56 y.o.  Primary Physician:  Owens Loffler, MD   Chief Complaint  Patient presents with  . Annual Exam   Subjective:   Willie Johnson is a 56 y.o. pleasant patient who presents with the following:  Preventative Health Maintenance Visit:  Health Maintenance Summary Reviewed and updated, unless pt declines services.  Tobacco History Reviewed. Alcohol: No concerns, no excessive use Exercise Habits: Some activity, rec at least 30 mins 5 times a week STD concerns: no risk or activity to increase risk Drug Use: None Encouraged self-testicular check  Several weeks - pressure in temple and jaw - pressure and pain.   More tired than expected with exertion. Exertional fatigue and some shortness of breath.  This is been ongoing since at least September.  Prior to this he was in fairly good shape per his report.  Cardiac risk factors include smoking, sister deceased from coronary disease in her early 58s, hypertension, as well as hyperlipidemia.  Repeat.   Frontal sinusitis  Health Maintenance  Topic Date Due  . COLONOSCOPY  05/08/2023  . TETANUS/TDAP  12/13/2023  . INFLUENZA VACCINE  Completed  . Hepatitis C Screening  Completed  . HIV Screening  Completed   Immunization History  Administered Date(s) Administered  . Influenza,inj,Quad PF,6+ Mos 11/27/2014, 01/19/2016, 01/07/2017  . Influenza-Unspecified 11/29/2013  . Tdap 12/12/2013   Patient Active Problem List   Diagnosis Date Noted  . Essential hypertension 11/30/2016  . Panic attacks 11/30/2016  . Dysuria 04/18/2015  . Hyperlipidemia   . Allergic rhinitis due to pollen    Past Medical History:  Diagnosis Date  . Allergic rhinitis due to pollen   . Diverticulosis   . GERD  (gastroesophageal reflux disease)   . Hyperlipidemia    Past Surgical History:  Procedure Laterality Date  . FRACTURE SURGERY     Social History   Socioeconomic History  . Marital status: Single    Spouse name: Not on file  . Number of children: Not on file  . Years of education: Not on file  . Highest education level: Not on file  Social Needs  . Financial resource strain: Not on file  . Food insecurity - worry: Not on file  . Food insecurity - inability: Not on file  . Transportation needs - medical: Not on file  . Transportation needs - non-medical: Not on file  Occupational History  . Occupation: software  Tobacco Use  . Smoking status: Current Every Day Smoker    Packs/day: 0.50    Years: 20.00    Pack years: 10.00    Last attempt to quit: 04/08/2012    Years since quitting: 4.8  . Smokeless tobacco: Current User  . Tobacco comment: Vape  Substance and Sexual Activity  . Alcohol use: No    Alcohol/week: 0.0 oz  . Drug use: No  . Sexual activity: Yes    Partners: Male  Other Topics Concern  . 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 Arthritis Mother   . Hyperlipidemia Mother   . Heart disease Mother   . Hypertension Mother   .  Diabetes Mother   . Hyperlipidemia Father   . Heart disease Father   . Stroke Father   . Pulmonary embolism Sister   . Colon cancer Neg Hx   . Pancreatic cancer Neg Hx   . Stomach cancer Neg Hx    No Known Allergies  Medication list has been reviewed and updated.   General: Denies fever, chills, sweats. No significant weight loss. Eyes: Denies blurring,significant itching ENT: Denies earache, sore throat, and hoarseness. Cardiovascular: + DOE Respiratory: Denies cough, dyspnea at rest,wheeezing Breast: no concerns about lumps GI: Denies nausea, vomiting, diarrhea,  constipation, change in bowel habits, abdominal pain, melena, hematochezia GU: Denies penile discharge, ED, urinary flow / outflow problems. No STD concerns. Musculoskeletal: Denies back pain, joint pain Derm: Denies rash, itching Neuro: Denies  paresthesias, frequent falls, frequent headaches Psych: Denies depression, anxiety Endocrine: Denies cold intolerance, heat intolerance, polydipsia Heme: Denies enlarged lymph nodes Allergy: No hayfever  Objective:   BP 120/72   Pulse 67   Temp 98.3 F (36.8 C) (Oral)   Ht _0  (1.753 m)   Wt 218 lb 12 oz (99.2 kg)   BMI 32.30 kg/m  Ideal Body Weight: Weight in (lb) to have BMI = 25: 168.9  No exam data present  GEN: well developed, well nourished, no acute distress Eyes: conjunctiva and lids normal, PERRLA, EOMI ENT: TM clear, nares clear, oral exam WNL Neck: supple, no lymphadenopathy, no thyromegaly, no JVD Pulm: clear to auscultation and percussion, respiratory effort normal CV: regular rate and rhythm, S1-S2, no murmur, rub or gallop, no bruits, peripheral pulses normal and symmetric, no cyanosis, clubbing, edema or varicosities GI: soft, non-tender; no hepatosplenomegaly, masses; active bowel sounds all quadrants GU: no hernia, testicular mass, penile discharge Lymph: no cervical, axillary or inguinal adenopathy MSK: gait normal, muscle tone and strength WNL, no joint swelling, effusions, discoloration, crepitus  SKIN: clear, good turgor, color WNL, no rashes, lesions, or ulcerations Neuro: normal mental status, normal strength, sensation, and motion Psych: alert; oriented to person, place and time, normally interactive and not anxious or depressed in appearance. All labs reviewed with patient.  Lipids:    Component Value Date/Time   CHOL 170 02/04/2017 0832   TRIG 218.0 (H) 02/04/2017 0832   HDL 31.20 (L) 02/04/2017 0832   LDLDIRECT 111.0 02/04/2017 0832   VLDL 43.6 (H) 02/04/2017 0832   CHOLHDL 5 02/04/2017 0832    CBC: CBC Latest Ref Rng & Units 02/04/2017 11/13/2016 01/14/2016  WBC 4.0 - 10.5 K/uL 7.7 9.6 8.3  Hemoglobin 13.0 - 17.0 g/dL 14.6 14.4 15.0  Hematocrit 39.0 - 52.0 % 43.7 41.7 42.0  Platelets 150.0 - 400.0 K/uL 283.0 280 347.4    Basic Metabolic Panel:    Component Value Date/Time   NA 139 02/04/2017 0832   K 4.4 02/04/2017 0832   CL 105 02/04/2017 0832   CO2 29 02/04/2017 0832   BUN 14 02/04/2017 0832   CREATININE 1.05 02/04/2017 0832   GLUCOSE 113 (H) 02/04/2017 0832   CALCIUM 9.6 02/04/2017 0832   Hepatic Function Latest Ref Rng & Units 02/04/2017 01/14/2016 11/20/2014  Total Protein 6.0 - 8.3 g/dL 6.7 6.6 6.7  Albumin 3.5 - 5.2 g/dL 4.1 4.0 4.1  AST 0 - 37 U/L _1 ALT 0 - 53 U/L _2 Alk Phosphatase 39 - 117 U/L 85 76 89  Total Bilirubin 0.2 - 1.2 mg/dL 0.6 0.4 0.8  Bilirubin, Direct 0.0 - 0.3 mg/dL 0.1 0.1 0.2  Lab Results  Component Value Date   TSH 1.33 08/01/2015   Lab Results  Component Value Date   PSA 0.66 02/04/2017   PSA 0.90 01/14/2016   PSA 0.66 11/20/2014    Assessment and Plan:   Healthcare maintenance  Dyspnea on exertion - Plan: Myocardial Perfusion Imaging  Given that the patient has been having very significant fatigue as well as some dyspnea with exertion for the past 2 months, obtain a exercise myocardial perfusion stress test to evaluate per potential ischemia.  Does have a sister died from coronary disease in her early 58s as well as hyperlipidemia and hypertension.  Addendum: 02/16/2017:  Insurance has denied the patient's stress test, so I will consult Cardiology directly for their assistance with the same reasons as above.  Health Maintenance Exam: The patient's preventative maintenance and recommended screening tests for an annual wellness exam were reviewed in full today. Brought up to date unless services declined.  Counselled on the importance of diet, exercise, and its role in overall health and mortality. The  patient's FH and SH was reviewed, including their home life, tobacco status, and drug and alcohol status.  Follow-up in 1 year for physical exam or additional follow-up below.  Follow-up: No Follow-up on file. Or follow-up in 1 year if not noted.  Meds ordered this encounter  Medications  . amoxicillin-clavulanate (AUGMENTIN) 875-125 MG tablet    Sig: Take 1 tablet by mouth 2 (two) times daily for 10 days.    Dispense:  20 tablet    Refill:  0   There are no discontinued medications. Orders Placed This Encounter  Procedures  . Myocardial Perfusion Imaging    Signed,  Shantel Wesely T. Dontario Evetts, MD   Allergies as of 02/10/2017   No Known Allergies     Medication List        Accurate as of 02/10/17  9:55 AM. Always use your most recent med list.          amoxicillin-clavulanate 875-125 MG tablet Commonly known as:  AUGMENTIN Take 1 tablet by mouth 2 (two) times daily for 10 days.   aspirin 81 MG tablet Take 81 mg by mouth daily.   atorvastatin 20 MG tablet Commonly known as:  LIPITOR TAKE 1 TABLET DAILY. PLEASEMAKE AN APPOINTMENT WITH   DR.Ling Flesch   hydrochlorothiazide 12.5 MG tablet Commonly known as:  HYDRODIURIL Take 1 tablet (12.5 mg total) by mouth daily.   LORazepam 0.5 MG tablet Commonly known as:  ATIVAN Take 1 tablet (0.5 mg total) by mouth every 8 (eight) hours as needed for anxiety.   losartan 50 MG tablet Commonly known as:  COZAAR Take 1 tablet (50 mg total) daily by mouth.   multivitamin tablet Take 1 tablet by mouth daily.   pantoprazole 40 MG tablet Commonly known as:  PROTONIX Take 1 tablet (40 mg total) by mouth daily.   ranitidine 75 MG tablet Commonly known as:  ZANTAC Take 75 mg by mouth 2 (two) times daily.

## 2017-02-10 NOTE — Patient Instructions (Signed)

## 2017-02-16 ENCOUNTER — Telehealth: Payer: Self-pay | Admitting: Family Medicine

## 2017-02-16 NOTE — Telephone Encounter (Signed)
Copied from [place]. Topic: Quick Communication - See Telephone Encounter    >> Feb 16, 2017  1:44 PM Antonieta Iba C wrote: CRM for notification. See Telephone encounter for: pt says that provider ([facility]) started him on amoxacillin, he said that he since got a rash, pt says that he stopped taking medication. He just want to make provider aware.   02/16/17.

## 2017-02-16 NOTE — Addendum Note (Signed)
Addended by: Owens Loffler on: 02/16/2017 02:56 PM   Modules accepted: Orders

## 2017-02-16 NOTE — Telephone Encounter (Signed)
noted 

## 2017-02-18 ENCOUNTER — Inpatient Hospital Stay (HOSPITAL_COMMUNITY)
Admission: RE | Admit: 2017-02-18 | Payer: Self-pay | Source: Ambulatory Visit | Attending: Family Medicine | Admitting: Family Medicine

## 2017-02-23 NOTE — Progress Notes (Signed)
Cardiology Office Note   Date:  02/25/2017   ID:  Willie Johnson, DOB Aug 09, 1960, MRN [id number]  PCP:  Owens Loffler, MD  Cardiologist:   No primary care provider on file. Referring:  Owens Loffler, MD  Chief Complaint  Patient presents with  . Shortness of Breath      History of Present Illness: Willie Johnson is a 56 y.o. male who is referred by Owens Loffler, MD for evaluation of dyspnea.  He said that over 6 months he has been getting increased shortness of breath and fatigue with exercise or activity such as working in the yard.  He has had a little bit of left chest and left shoulder discomfort and some mild jaw tightness perhaps.  Mostly he has had decreased exercise tolerance and dyspnea as a complaint.  Is not describing resting shortness of breath, PND or orthopnea.  Is not having any palpitations, presyncope or syncope.  He does not exercise routinely as he has a long commute.  He does have a strong family history of early heart disease.   Past Medical History:  Diagnosis Date  . Allergic rhinitis due to pollen   . Diverticulosis   . GERD (gastroesophageal reflux disease)   . HTN (hypertension)   . Hyperlipidemia     Past Surgical History:  Procedure Laterality Date  . FRACTURE SURGERY       Current Outpatient Medications  Medication Sig Dispense Refill  . aspirin 81 MG tablet Take 81 mg by mouth daily.    Marland Kitchen atorvastatin (LIPITOR) 20 MG tablet TAKE 1 TABLET DAILY. PLEASEMAKE AN APPOINTMENT WITH   DR.COPLAND 90 tablet 2  . LORazepam (ATIVAN) 0.5 MG tablet Take 1 tablet (0.5 mg total) by mouth every 8 (eight) hours as needed for anxiety. 10 tablet 1  . losartan (COZAAR) 50 MG tablet Take 1 tablet (50 mg total) daily by mouth. 30 tablet 3  . Multiple Vitamin (MULTIVITAMIN) tablet Take 1 tablet by mouth daily.    . pantoprazole (PROTONIX) 40 MG tablet Take 1 tablet (40 mg total) by mouth daily. 30 tablet 3  . ranitidine (ZANTAC) 75 MG tablet Take  75 mg by mouth 2 (two) times daily.      No current facility-administered medications for this visit.     Allergies:   Patient has no known allergies.    Social History:  The patient  reports that he has been smoking.  He has a 10.00 pack-year smoking history. He uses smokeless tobacco. He reports that he does not drink alcohol or use drugs.   Family History:  The patient's family history includes Arthritis in his mother; Diabetes in his mother; Heart disease in his father and mother; Hyperlipidemia in his father and mother; Hypertension in his mother; Pulmonary embolism in his sister; Stroke in his father.    ROS:  Please see the history of present illness.   Otherwise, review of systems are positive for none.   All other systems are reviewed and negative.    PHYSICAL EXAM: VS:  BP 134/86   Pulse 68   Ht 5\' 9"  (1.753 m)   Wt 218 lb (98.9 kg)   BMI 32.19 kg/m  , BMI Body mass index is 32.19 kg/m. GENERAL:  Well appearing HEENT:  Pupils equal round and reactive, fundi not visualized, oral mucosa unremarkable NECK:  No jugular venous distention, waveform within normal limits, carotid upstroke brisk and symmetric, no bruits, no thyromegaly LYMPHATICS:  No cervical, inguinal adenopathy  LUNGS:  Clear to auscultation bilaterally BACK:  No CVA tenderness CHEST:  Unremarkable HEART:  PMI not displaced or sustained,S1 and S2 within normal limits, no S3, no S4, no clicks, no rubs, no murmurs ABD:  Flat, positive bowel sounds normal in frequency in pitch, no bruits, no rebound, no guarding, no midline pulsatile mass, no hepatomegaly, no splenomegaly EXT:  2 plus pulses throughout, no edema, no cyanosis no clubbing SKIN:  No rashes no nodules NEURO:  Cranial nerves II through XII grossly intact, motor grossly intact throughout PSYCH:  Cognitively intact, oriented to person place and time    EKG:  EKG is ordered today. The ekg ordered today demonstrates sinus rhythm, rate 63, axis within  normal limits, intervals within normal limits, no acute ST-T wave changes.   Recent Labs: 02/04/2017: ALT 27; BUN 14; Creatinine, Ser 1.05; Hemoglobin 14.6; Platelets 283.0; Potassium 4.4; Sodium 139    Lipid Panel    Component Value Date/Time   CHOL 170 02/04/2017 0832   TRIG 218.0 (H) 02/04/2017 0832   HDL 31.20 (L) 02/04/2017 0832   CHOLHDL 5 02/04/2017 0832   VLDL 43.6 (H) 02/04/2017 0832   LDLCALC 93 01/14/2016 0823   LDLDIRECT 111.0 02/04/2017 0832      Wt Readings from Last 3 Encounters:  02/25/17 218 lb (98.9 kg)  02/10/17 218 lb 12 oz (99.2 kg)  12/20/16 217 lb 4 oz (98.5 kg)      Other studies Reviewed: Additional studies/ records that were reviewed today include: . Labs Review of the above records demonstrates:  Please see elsewhere in the note.    ASSESSMENT AND PLAN:    DYSPNEA: The patient has significant cardiovascular risk factors.  To screen for obstructive coronary disease I will check a coronary calcium POET (Plain Old Exercise Treadmill)  HTN: Blood pressure is borderline.  Rather than adding another medication I will ask him to lose weight and increase exercise and keep a blood pressure diary.  DYSLIPIDEMIA: The goals of therapy will be based on the results of the above testing.  Current medicines are reviewed at length with the patient today.  The patient does not have concerns regarding medicines.  The following changes have been made:  no change  Labs/ tests ordered today include:   Orders Placed This Encounter  Procedures  . CT CARDIAC SCORING  . Exercise Tolerance Test     Disposition:   FU with me as needed     Signed, Minus Breeding, MD  02/25/2017 9:37 AM    [place]

## 2017-02-25 ENCOUNTER — Ambulatory Visit: Payer: 59 | Admitting: Cardiology

## 2017-02-25 ENCOUNTER — Encounter: Payer: Self-pay | Admitting: Cardiology

## 2017-02-25 ENCOUNTER — Telehealth: Payer: Self-pay | Admitting: Cardiology

## 2017-02-25 VITALS — BP 134/86 | HR 68 | Ht 69.0 in | Wt 218.0 lb

## 2017-02-25 DIAGNOSIS — I10 Essential (primary) hypertension: Secondary | ICD-10-CM

## 2017-02-25 DIAGNOSIS — R0602 Shortness of breath: Secondary | ICD-10-CM

## 2017-02-25 NOTE — Patient Instructions (Signed)
Medication Instructions:   Continue current medications  If you need a refill on your cardiac medications before your next appointment, please call your pharmacy.  Labwork: None Ordered   Testing/Procedures: Your physician has requested that you have an exercise tolerance test. For further information please visit [email or web address]. Please also follow instruction sheet, as given.  Your physician has requested that you have a Coronary Calcium Score. This test is done at our [facility].Marland Kitchen  Special Instructions:  Happy Holidays!!  Follow-Up: Your physician wants you to follow-up in: As Needed.   Thank you for choosing [facility] at [facility]!!

## 2017-03-02 ENCOUNTER — Telehealth (HOSPITAL_COMMUNITY): Payer: Self-pay

## 2017-03-02 NOTE — Telephone Encounter (Signed)
Encounter complete. 

## 2017-03-03 NOTE — Telephone Encounter (Signed)
03/03/2017 Received faxed referral packet from Raeanne Gathers, MD for upcoming appointment with Dr. Stanford Breed on 03/22/2016. cbr

## 2017-03-03 NOTE — Addendum Note (Signed)
Addended by: Vennie Homans on: 03/03/2017 04:21 PM   Modules accepted: Orders

## 2017-03-04 ENCOUNTER — Other Ambulatory Visit: Payer: Self-pay | Admitting: *Deleted

## 2017-03-04 ENCOUNTER — Ambulatory Visit (HOSPITAL_COMMUNITY)
Admission: RE | Admit: 2017-03-04 | Discharge: 2017-03-04 | Disposition: A | Discharge status: Home / Self Care | Payer: 59 | Source: Ambulatory Visit | Attending: Cardiovascular Disease | Admitting: Cardiovascular Disease

## 2017-03-04 DIAGNOSIS — R0602 Shortness of breath: Secondary | ICD-10-CM | POA: Diagnosis not present

## 2017-03-04 LAB — EXERCISE TOLERANCE TEST
CSEPED: 9 min
CSEPEW: 10.1 METS
CSEPHR: 93 %
CSEPPHR: 155 {beats}/min
Exercise duration (sec): 0 s
MPHR: 166 {beats}/min
RPE: 18
Rest HR: 68 {beats}/min

## 2017-03-04 MED ORDER — LOSARTAN POTASSIUM 50 MG PO TABS: 50.0000 mg | ORAL_TABLET | Freq: Every day | ORAL | 0 refills | Status: DC

## 2017-03-04 NOTE — Telephone Encounter (Signed)
Received fax from [facility] requesting 90 day supply.

## 2017-03-07 ENCOUNTER — Ambulatory Visit (INDEPENDENT_AMBULATORY_CARE_PROVIDER_SITE_OTHER)
Admission: RE | Admit: 2017-03-07 | Discharge: 2017-03-07 | Disposition: A | Discharge status: Home / Self Care | Payer: Self-pay | Source: Ambulatory Visit | Attending: Cardiology | Admitting: Cardiology

## 2017-03-07 DIAGNOSIS — R0602 Shortness of breath: Secondary | ICD-10-CM

## 2017-03-16 ENCOUNTER — Telehealth: Payer: Self-pay | Admitting: *Deleted

## 2017-03-16 DIAGNOSIS — Z79899 Other long term (current) drug therapy: Secondary | ICD-10-CM

## 2017-03-16 DIAGNOSIS — E785 Hyperlipidemia, unspecified: Secondary | ICD-10-CM

## 2017-03-16 MED ORDER — ATORVASTATIN CALCIUM 40 MG PO TABS: 40.0000 mg | ORAL_TABLET | Freq: Every day | ORAL | 3 refills | Status: DC

## 2017-03-16 NOTE — Telephone Encounter (Signed)
Spoke with pt about his calcium scoring test and to increase Lipitor(Atorvastatin) to 40 mg daily Rx has been sent to the pharmacy electronically. Atorvastatin 40 mg #90 3 refills Fasting Lipid Liver ordered and mailed to pt to get done in 8 weeks, pt is also aware.

## 2017-03-16 NOTE — Telephone Encounter (Signed)
-----   Message from Minus Breeding, MD sent at 03/13/2017  2:00 PM EST ----- The patient does have some mild coronary calcium that is average for his age.  He had a negative POET (Plain Old Exercise Treadmill).  No further testing is indicated at this point.

## 2017-03-17 ENCOUNTER — Encounter: Payer: Self-pay | Admitting: Cardiology

## 2017-06-01 ENCOUNTER — Other Ambulatory Visit: Payer: Self-pay | Admitting: Family Medicine

## 2017-06-22 ENCOUNTER — Other Ambulatory Visit: Payer: Self-pay

## 2017-06-22 ENCOUNTER — Encounter: Payer: Self-pay | Admitting: Internal Medicine

## 2017-06-22 MED ORDER — PANTOPRAZOLE SODIUM 40 MG PO TBEC: 40.0000 mg | DELAYED_RELEASE_TABLET | Freq: Every day | ORAL | 3 refills | Status: DC

## 2017-06-22 NOTE — Telephone Encounter (Signed)
Please let patient know I received his email Patient with history of dyspepsia and left upper quadrant pain, previously completely responsive to twice daily ranitidine 150 mg Now ongoing symptoms despite twice daily ranitidine With symptoms refractory to ranitidine and the long-standing nature of the symptoms I do think proceeding to upper endoscopy is reasonable and appropriate I would have him start pantoprazole 40 mg once daily 30 minutes before breakfast.  He tried this in the past with good result.  This is a better/more potent acid suppressor than his ranitidine.  Ranitidine can still be used 150 mg in the evening or at bedtime as needed.

## 2017-07-11 ENCOUNTER — Telehealth: Payer: Self-pay | Admitting: Internal Medicine

## 2017-07-11 NOTE — Telephone Encounter (Signed)
Pt has been taken Omeprazole for a little while and has noticed that he has lost some weight. However, his diet has not changed. He wants to know if it could be related to Omeprazole.

## 2017-07-11 NOTE — Telephone Encounter (Signed)
Pt was started on Protonix the middle of April. States he has gained 5 pounds and his diet has not changed and he states he is eating less food than he was. He wanted to know if this could be related to the protonix and if he should continue taking the protonix. Pt knows that Dr. Hilarie Fredrickson is off today. Please advise.

## 2017-07-12 NOTE — Telephone Encounter (Signed)
I would not think the weight gain would be related to pantoprazole That said, I previously recommended upper endoscopy at note on 06/22/2017, which I recommend he have performed

## 2017-07-12 NOTE — Telephone Encounter (Signed)
Spoke with pt and he is aware. States he will call back tomorrow to schedule EGD.

## 2017-07-13 NOTE — Telephone Encounter (Signed)
Pt scheduled for previsit 07/15/17@3 :30pm, EGD scheduled in the [place] 08/03/17@10am . Pt aware of appts.

## 2017-07-15 ENCOUNTER — Other Ambulatory Visit: Payer: Self-pay

## 2017-07-15 ENCOUNTER — Ambulatory Visit (AMBULATORY_SURGERY_CENTER): Payer: Self-pay | Admitting: *Deleted

## 2017-07-15 VITALS — Ht 69.0 in | Wt 222.0 lb

## 2017-07-15 DIAGNOSIS — R1012 Left upper quadrant pain: Secondary | ICD-10-CM

## 2017-07-15 DIAGNOSIS — R1013 Epigastric pain: Secondary | ICD-10-CM

## 2017-07-15 NOTE — Progress Notes (Signed)
No egg or soy allergy known to patient  No issues with past sedation with any surgeries  or procedures, no intubation problems  No diet pills per patient No home 02 use per patient  No blood thinners per patient  Pt denies issues with constipation  No A fib or A flutter  EMMI video sent to pt's e mail pt declined   

## 2017-07-21 ENCOUNTER — Encounter: Payer: Self-pay | Admitting: Internal Medicine

## 2017-08-03 ENCOUNTER — Encounter: Payer: Self-pay | Admitting: Internal Medicine

## 2017-09-12 ENCOUNTER — Ambulatory Visit: Payer: Self-pay | Admitting: Internal Medicine

## 2017-11-23 ENCOUNTER — Other Ambulatory Visit: Payer: Self-pay | Admitting: Family Medicine

## 2017-12-14 ENCOUNTER — Ambulatory Visit: Payer: 59 | Admitting: Internal Medicine

## 2017-12-14 ENCOUNTER — Encounter: Payer: Self-pay | Admitting: Internal Medicine

## 2017-12-14 VITALS — BP 124/82 | HR 67 | Ht 68.5 in | Wt 224.0 lb

## 2017-12-14 DIAGNOSIS — K219 Gastro-esophageal reflux disease without esophagitis: Secondary | ICD-10-CM

## 2017-12-14 DIAGNOSIS — R140 Abdominal distension (gaseous): Secondary | ICD-10-CM | POA: Diagnosis not present

## 2017-12-14 DIAGNOSIS — R1013 Epigastric pain: Secondary | ICD-10-CM | POA: Diagnosis not present

## 2017-12-14 NOTE — Progress Notes (Signed)
Subjective:    Patient ID: Willie Johnson, male    DOB: 1960-09-28, 57 y.o.   MRN: [id number]  HPI Willie Johnson is a 57 year old male with a past medical history of GERD with dyspepsia, prior left upper quadrant pain who is here for follow-up.  He was last seen in the office 1 year ago.  He is here alone today.  He reports that he has been continuing to struggle with epigastric abdominal pain and substernal chest pain particularly at night.  This is worse when he lies down despite having the head of his bed propped up.  He has belching and regurgitation symptoms at night.  2 times for 4 to 5 weeks each he use pantoprazole 40 mg in the last year.  He did not think he could use this on a chronic basis.  It did help.  He was previously taking Zantac 150 mg twice daily which also improved symptoms though never completely prevented all of his symptoms.  Symptoms interfere with sleep.  Definitively worse with stress.  He has modified his diet and avoids carbonated beverages, chocolate, spicy foods, tomato-based foods and is trying to eat a bland diet.  He denies dysphagia and odynophagia.  No nausea or vomiting.  Appetite is good.  Bowel movements are regular.  No melena.  Most recently he is been trying Pepcid 20 mg twice daily without good control of his symptoms.  He also chews Tums at night if needed.  His previous left upper quadrant pain has resolved though can be exacerbated if he lifts weights or does strenuous activity, he feels that this may have been secondary to a muscle strain or possibly costochondritis.   Review of Systems As per HPI, otherwise negative  Current Medications, Allergies, Past Medical History, Past Surgical History, Family History and Social History were reviewed in [employer] record.     Objective:   Physical Exam BP 124/82   Pulse 67   Ht 5' 8.5" (1.74 m)   Wt 224 lb (101.6 kg)   BMI 33.56 kg/m  Constitutional: Well-developed and  well-nourished. No distress. HEENT: Normocephalic and atraumatic.  Conjunctivae are normal.  No scleral icterus. Neck: Neck supple. Trachea midline. Cardiovascular: Normal rate, regular rhythm and intact distal pulses. No M/R/G Pulmonary/chest: Effort normal and breath sounds normal. No wheezing, rales or rhonchi. Abdominal: Soft, epigastric tenderness which is moderate without rebound or guarding, abdomen is nondistended bowel sounds active throughout. There are no masses palpable. No hepatosplenomegaly. Extremities: no clubbing, cyanosis, or edema Neurological: Alert and oriented to person place and time. Skin: Skin is warm and dry. Psychiatric: Normal mood and affect. Behavior is normal.     Assessment & Plan:  57 year old male with a past medical history of GERD with dyspepsia, prior left upper quadrant pain who is here for follow-up.   1. GERD/dyspepsia/belching --we discussed therapy but I recommend endoscopy.  We have never performed endoscopy and I would like to exclude esophagitis, gastritis, hiatal hernia, H. pylori.  Also rule out Barrett's esophagus.  We discussed chronic PPI therapy and given the severity of his symptoms I feel this is appropriate for him.  We discussed chronic PPI therapy and the risks thereof.  After this discussion he is comfortable with pantoprazole 40 mg daily.  Should take this 30 minutes before breakfast.  Can use Pepcid 20 to 40 mg in the evening as needed.  Liquid Gaviscon can be used as a back-up to Pepcid in the evening or  at night.  Further recommendations after endoscopy.  2.  CRC screening --screening colonoscopy performed in 2015 and he had mild diverticulosis and a 5 mm a sending colon polyp which was not adenomatous.  Recall 10 years; 2025  25 minutes spent with the patient today. Greater than 50% was spent in counseling and coordination of care with the patient

## 2017-12-14 NOTE — Patient Instructions (Signed)
You have been scheduled for an endoscopy. Please follow written instructions given to you at your visit today. If you use inhalers (even only as needed), please bring them with you on the day of your procedure. Your physician has requested that you go to www.startemmi.com and enter the access code given to you at your visit today. This web site gives a general overview about your procedure. However, you should still follow specific instructions given to you by our office regarding your preparation for the procedure.  We have sent the following medications to your pharmacy for you to pick up at your convenience: Pantoprazole 40 mg daily  Please purchase the following medications over the counter and take as directed: Pepcid 20-40 mg every night G[place]ison if needed (if pepcid is not effective)  If you are age 4 or older, your body mass index should be between 23-30. Your Body mass index is 33.56 kg/m. If this is out of the aforementioned range listed, please consider follow up with your Primary Care Provider.  If you are age 34 or younger, your body mass index should be between 19-25. Your Body mass index is 33.56 kg/m. If this is out of the aformentioned range listed, please consider follow up with your Primary Care Provider.

## 2018-01-06 ENCOUNTER — Encounter: Payer: Self-pay | Admitting: Internal Medicine

## 2018-01-16 ENCOUNTER — Other Ambulatory Visit: Payer: Self-pay

## 2018-01-16 ENCOUNTER — Ambulatory Visit (AMBULATORY_SURGERY_CENTER): Payer: 59 | Admitting: Internal Medicine

## 2018-01-16 ENCOUNTER — Encounter: Payer: Self-pay | Admitting: Internal Medicine

## 2018-01-16 VITALS — BP 130/76 | HR 77 | Temp 98.9°F | Resp 18 | Ht 68.5 in | Wt 224.0 lb

## 2018-01-16 DIAGNOSIS — K2970 Gastritis, unspecified, without bleeding: Secondary | ICD-10-CM | POA: Diagnosis present

## 2018-01-16 DIAGNOSIS — K2950 Unspecified chronic gastritis without bleeding: Secondary | ICD-10-CM | POA: Diagnosis not present

## 2018-01-16 DIAGNOSIS — R1013 Epigastric pain: Secondary | ICD-10-CM

## 2018-01-16 DIAGNOSIS — K219 Gastro-esophageal reflux disease without esophagitis: Secondary | ICD-10-CM

## 2018-01-16 MED ORDER — SODIUM CHLORIDE 0.9 % IV SOLN: 500.0000 mL | Freq: Once | INTRAVENOUS | Status: DC

## 2018-01-16 NOTE — Progress Notes (Signed)
Called to room to assist during endoscopic procedure.  Patient ID and intended procedure confirmed with present staff. Received instructions for my participation in the procedure from the performing physician.  

## 2018-01-16 NOTE — Progress Notes (Signed)
A/ox3 pleased with MAC, report to RN 

## 2018-01-16 NOTE — Patient Instructions (Signed)
Continue present medications. Await pathology results.     YOU HAD AN ENDOSCOPIC PROCEDURE TODAY AT [facility]:   Refer to the procedure report that was given to you for any specific questions about what was found during the examination.  If the procedure report does not answer your questions, please call your gastroenterologist to clarify.  If you requested that your care partner not be given the details of your procedure findings, then the procedure report has been included in a sealed envelope for you to review at your convenience later.  YOU SHOULD EXPECT: Some feelings of bloating in the abdomen. Passage of more gas than usual.  Walking can help get rid of the air that was put into your GI tract during the procedure and reduce the bloating. If you had a lower endoscopy (such as a colonoscopy or flexible sigmoidoscopy) you may notice spotting of blood in your stool or on the toilet paper. If you underwent a bowel prep for your procedure, you may not have a normal bowel movement for a few days.  Please Note:  You might notice some irritation and congestion in your nose or some drainage.  This is from the oxygen used during your procedure.  There is no need for concern and it should clear up in a day or so.  SYMPTOMS TO REPORT IMMEDIATELY:    Following upper endoscopy (EGD)  Vomiting of blood or coffee ground material  New chest pain or pain under the shoulder blades  Painful or persistently difficult swallowing  New shortness of breath  Fever of 100F or higher  Black, tarry-looking stools  For urgent or emergent issues, a gastroenterologist can be reached at any hour by calling [phone].   DIET:  We do recommend a small meal at first, but then you may proceed to your regular diet.  Drink plenty of fluids but you should avoid alcoholic beverages for 24 hours.  ACTIVITY:  You should plan to take it easy for the rest of today and you should NOT DRIVE or use heavy  machinery until tomorrow (because of the sedation medicines used during the test).    FOLLOW UP: Our staff will call the number listed on your records the next business day following your procedure to check on you and address any questions or concerns that you may have regarding the information given to you following your procedure. If we do not reach you, we will leave a message.  However, if you are feeling well and you are not experiencing any problems, there is no need to return our call.  We will assume that you have returned to your regular daily activities without incident.  If any biopsies were taken you will be contacted by phone or by letter within the next 1-3 weeks.  Please call us at [phone] if you have not heard about the biopsies in 3 weeks.    SIGNATURES/CONFIDENTIALITY: You and/or your care partner have signed paperwork which will be entered into your electronic medical record.  These signatures attest to the fact that that the information above on your After Visit Summary has been reviewed and is understood.  Full responsibility of the confidentiality of this discharge information lies with you and/or your care-partner.

## 2018-01-16 NOTE — Op Note (Signed)
[facility] Patient Name: Willie Johnson Procedure Date: 01/16/2018 10:11 AM MRN: [id number] Endoscopist: Jerene Bears , MD Age: 57 Referring MD:  Date of Birth: June 11, 1960 Gender: Male Account #: [id number] Procedure:                Upper GI endoscopy Indications:              Abdominal pain in the left upper quadrant,                            Dyspepsia, Gastro-esophageal reflux disease Medicines:                Monitored Anesthesia Care Procedure:                Pre-Anesthesia Assessment:                           - Prior to the procedure, a History and Physical                            was performed, and patient medications and                            allergies were reviewed. The patient's tolerance of                            previous anesthesia was also reviewed. The risks                            and benefits of the procedure and the sedation                            options and risks were discussed with the patient.                            All questions were answered, and informed consent                            was obtained. Prior Anticoagulants: The patient has                            taken no previous anticoagulant or antiplatelet                            agents. ASA Grade Assessment: II - A patient with                            mild systemic disease. After reviewing the risks                            and benefits, the patient was deemed in                            satisfactory condition to undergo the procedure.  After obtaining informed consent, the endoscope was                            passed under direct vision. Throughout the                            procedure, the patient's blood pressure, pulse, and                            oxygen saturations were monitored continuously. The                            Model GIF-HQ190 [id number]) scope was introduced                            through the mouth,  and advanced to the second part                            of duodenum. The upper GI endoscopy was                            accomplished without difficulty. The patient                            tolerated the procedure well. Scope In: Scope Out: Findings:                 In the proximal esophagus there was a small inlet                            patch without nodularity or visible abnormality.                           The examined esophagus was normal.                           The entire examined stomach was normal. Biopsies                            were taken with a cold forceps for histology and                            Helicobacter pylori testing.                           The examined duodenum was normal. Complications:            No immediate complications. Estimated Blood Loss:     Estimated blood loss was minimal. Impression:               - Normal esophagus.                           - Normal stomach. Biopsied.                           -  Normal examined duodenum. Recommendation:           - Patient has a contact number available for                            emergencies. The signs and symptoms of potential                            delayed complications were discussed with the                            patient. Return to normal activities tomorrow.                            Written discharge instructions were provided to the                            patient.                           - Resume previous diet.                           - Continue present medications.                           - Await pathology results. Jerene Bears, MD 01/16/2018 10:27:42 AM This report has been signed electronically.

## 2018-01-16 NOTE — Progress Notes (Signed)
Pt's states no medical or surgical changes since previsit or office visit. 

## 2018-01-17 ENCOUNTER — Telehealth: Payer: Self-pay | Admitting: *Deleted

## 2018-01-17 NOTE — Telephone Encounter (Signed)
  Follow up Call-  Call back number 01/16/2018  Post procedure Call Back phone  # [phone]  Permission to leave phone message Yes  Some recent data might be hidden   [facility]

## 2018-01-17 NOTE — Telephone Encounter (Signed)
  Follow up Call-  Call back number 01/16/2018  Post procedure Call Back phone  # [phone]  Permission to leave phone message Yes  Some recent data might be hidden     Patient questions:  Do you have a fever, pain , or abdominal swelling? No. Pain Score  0 *  Have you tolerated food without any problems? Yes.    Have you been able to return to your normal activities? Yes.    Do you have any questions about your discharge instructions: Diet   No. Medications  No. Follow up visit  No.  Do you have questions or concerns about your Care? No.  Actions: * If pain score is 4 or above: No action needed, pain <4.

## 2018-01-23 ENCOUNTER — Encounter: Payer: Self-pay | Admitting: Internal Medicine

## 2018-02-20 ENCOUNTER — Other Ambulatory Visit: Payer: Self-pay | Admitting: *Deleted

## 2018-02-20 MED ORDER — LOSARTAN POTASSIUM 100 MG PO TABS: 50.0000 mg | ORAL_TABLET | Freq: Every day | ORAL | 0 refills | Status: DC

## 2018-02-20 NOTE — Telephone Encounter (Signed)
[facility] [place] stating Losartan 50 mg is on back order due to recall.  States Losartan 100 mg is still available.  Spoke with Dr. Lorelei Pont.  Ok to change to Losartan 100 mg to take 1/2 tablet daily.  Rx sent to pharmacy.

## 2018-03-18 ENCOUNTER — Other Ambulatory Visit: Payer: Self-pay | Admitting: Cardiology

## 2018-03-23 ENCOUNTER — Telehealth: Payer: Self-pay | Admitting: Internal Medicine

## 2018-03-23 NOTE — Telephone Encounter (Signed)
Pt's insurance will only cover 90 day supply of pantoprazole 40 mg within 365-day period.  Med requires PA: [phone]

## 2018-03-24 NOTE — Telephone Encounter (Signed)
We have received an approval from [facility] for patient's pantoprazole 40 mg from 03/24/2018-03/24/2019.  PA# [employer] [id number]  Dx: K21.9 GERD Tried and Failed: Pepcid 20 mg twice daily; ranitidine 150 mg, ranitidine 75 mg twice daily

## 2018-03-24 NOTE — Telephone Encounter (Signed)
I have completed prior authorization on [email or web address]. We will await insurance approval or denial.

## 2018-04-05 ENCOUNTER — Other Ambulatory Visit: Payer: Self-pay | Admitting: Family Medicine

## 2018-04-05 DIAGNOSIS — Z0000 Encounter for general adult medical examination without abnormal findings: Secondary | ICD-10-CM

## 2018-04-11 ENCOUNTER — Other Ambulatory Visit (INDEPENDENT_AMBULATORY_CARE_PROVIDER_SITE_OTHER): Payer: 59

## 2018-04-11 DIAGNOSIS — Z0000 Encounter for general adult medical examination without abnormal findings: Secondary | ICD-10-CM

## 2018-04-11 LAB — CBC WITH DIFFERENTIAL/PLATELET
Basophils Absolute: 0.1 10*3/uL (ref 0.0–0.1)
Basophils Relative: 0.8 % (ref 0.0–3.0)
EOS PCT: 3.1 % (ref 0.0–5.0)
Eosinophils Absolute: 0.2 10*3/uL (ref 0.0–0.7)
HCT: 42.1 % (ref 39.0–52.0)
Hemoglobin: 14.3 g/dL (ref 13.0–17.0)
Lymphocytes Relative: 27.2 % (ref 12.0–46.0)
Lymphs Abs: 2.2 10*3/uL (ref 0.7–4.0)
MCHC: 34.1 g/dL (ref 30.0–36.0)
MCV: 85.6 fl (ref 78.0–100.0)
MONOS PCT: 7.8 % (ref 3.0–12.0)
Monocytes Absolute: 0.6 10*3/uL (ref 0.1–1.0)
Neutro Abs: 5 10*3/uL (ref 1.4–7.7)
Neutrophils Relative %: 61.1 % (ref 43.0–77.0)
Platelets: 311 10*3/uL (ref 150.0–400.0)
RBC: 4.92 Mil/uL (ref 4.22–5.81)
RDW: 14.9 % (ref 11.5–15.5)
WBC: 8.1 10*3/uL (ref 4.0–10.5)

## 2018-04-11 LAB — LDL CHOLESTEROL, DIRECT: Direct LDL: 111 mg/dL

## 2018-04-11 LAB — BASIC METABOLIC PANEL
BUN: 12 mg/dL (ref 6–23)
CO2: 27 mEq/L (ref 19–32)
Calcium: 9.1 mg/dL (ref 8.4–10.5)
Chloride: 104 mEq/L (ref 96–112)
Creatinine, Ser: 0.98 mg/dL (ref 0.40–1.50)
GFR: 79.11 mL/min (ref >60.00)
Glucose, Bld: 124 mg/dL — ABNORMAL HIGH (ref 70–99)
Potassium: 3.7 mEq/L (ref 3.5–5.1)
Sodium: 139 mEq/L (ref 135–145)

## 2018-04-11 LAB — HEPATIC FUNCTION PANEL
ALT: 31 U/L (ref 0–53)
AST: 16 U/L (ref 0–37)
Albumin: 4.1 g/dL (ref 3.5–5.2)
Alkaline Phosphatase: 88 U/L (ref 39–117)
Bilirubin, Direct: 0.1 mg/dL (ref 0.0–0.3)
Total Bilirubin: 0.5 mg/dL (ref 0.2–1.2)
Total Protein: 6.9 g/dL (ref 6.0–8.3)

## 2018-04-11 LAB — HEMOGLOBIN A1C: Hgb A1c MFr Bld: 6.8 % — ABNORMAL HIGH (ref 4.6–6.5)

## 2018-04-11 LAB — LIPID PANEL
Cholesterol: 160 mg/dL (ref 0–200)
HDL: 29.9 mg/dL — ABNORMAL LOW (ref >39.00)
NonHDL: 130.09
Total CHOL/HDL Ratio: 5
Triglycerides: 221 mg/dL — ABNORMAL HIGH (ref 0.0–149.0)
VLDL: 44.2 mg/dL — ABNORMAL HIGH (ref 0.0–40.0)

## 2018-04-12 LAB — PSA, TOTAL AND FREE
PSA, % Free: 33 % (calc) (ref >25)
PSA, Free: 0.2 ng/mL
PSA, Total: 0.6 ng/mL (ref <=4.0)

## 2018-04-16 ENCOUNTER — Encounter: Payer: Self-pay | Admitting: Family Medicine

## 2018-04-16 DIAGNOSIS — E119 Type 2 diabetes mellitus without complications: Secondary | ICD-10-CM

## 2018-04-16 HISTORY — DX: Type 2 diabetes mellitus without complications: E11.9

## 2018-04-16 NOTE — Progress Notes (Signed)
Dr. Frederico Hamman T. Timathy Newberry, MD, [facility] [place], [address] Phone: [phone] Fax: [phone]  04/19/2018  Patient: Willie Johnson, MRN: [id number], DOB: 11-05-1960, 58 y.o.  Primary Physician:  Owens Loffler, MD   Chief Complaint  Patient presents with  . Annual Exam   Subjective:   Willie Johnson is a 58 y.o. pleasant patient who presents with the following:  Preventative Health Maintenance Visit:  Health Maintenance Summary Reviewed and updated, unless pt declines services.  Tobacco History Reviewed. Alcohol: No concerns, no excessive use Exercise Habits: Some activity, rec at least 30 mins 5 times a week STD concerns: no risk or activity to increase risk Drug Use: None Encouraged self-testicular check  Pneumovax Foot and eye exam  Emmi video  New onset DM Lab Results  Component Value Date   HGBA1C 6.8 (H) 04/11/2018    He also has new onset diabetes.  He has had some prediabetes ongoing for the last 2 or 3 years, and this is elevated, now with A1c 6.8.  He does know he is been eating quite poorly over the last few years and he is not getting any exercise at all. He is well educated about diabetes, his parents do have diabetes.   He also has new onset Type 2 diabetes mellitus. Lab Results  Component Value Date   HGBA1C 6.8 (H) 04/11/2018    Health Maintenance  Topic Date Due  . FOOT EXAM  03/25/1972  . OPHTHALMOLOGY EXAM  03/25/1972  . HEMOGLOBIN A1C  10/10/2018  . COLONOSCOPY  05/08/2023  . TETANUS/TDAP  12/13/2023  . INFLUENZA VACCINE  Completed  . PNEUMOCOCCAL POLYSACCHARIDE VACCINE AGE 56-64 HIGH RISK  Completed  . Hepatitis C Screening  Completed  . HIV Screening  Completed   Immunization History  Administered Date(s) Administered  . Influenza,inj,Quad PF,6+ Mos 11/27/2014, 01/19/2016, 01/07/2017, 12/09/2017  . Influenza-Unspecified 11/29/2013  . Pneumococcal  Polysaccharide-23 04/19/2018  . Tdap 12/12/2013  . Zoster Recombinat (Shingrix) 04/01/2018   Patient Active Problem List   Diagnosis Date Noted  . Type 2 diabetes mellitus without complication, without long-term current use of insulin ([place]) 04/16/2018  . Essential hypertension 11/30/2016  . Hyperlipidemia    Past Medical History:  Diagnosis Date  . Allergic rhinitis due to pollen   . Allergy   . Diverticulosis   . GERD (gastroesophageal reflux disease)   . HTN (hypertension)   . Hyperlipidemia   . Type 2 diabetes mellitus without complication, without long-term current use of insulin ([place]) 04/16/2018   Past Surgical History:  Procedure Laterality Date  . COLONOSCOPY    . FRACTURE SURGERY     hand- scafoid muscle   . KNEE SURGERY     Social History   Socioeconomic History  . Marital status: Married    Spouse name: Not on file  . Number of children: Not on file  . Years of education: Not on file  . Highest education level: Not on file  Occupational History  . Occupation: Engineer, materials Needs  . Financial resource strain: Not on file  . Food insecurity:    Worry: Not on file    Inability: Not on file  . Transportation needs:    Medical: Not on file    Non-medical: Not on file  Tobacco Use  . Smoking status: Former Smoker    Packs/day: 0.50    Years: 20.00    Pack years: 10.00  Last attempt to quit: 07/01/2017    Years since quitting: 0.8  . Smokeless tobacco: Never Used  . Tobacco comment: no vapes   Substance and Sexual Activity  . Alcohol use: No    Alcohol/week: 0.0 standard drinks  . Drug use: No  . Sexual activity: Yes    Partners: Male  Lifestyle  . Physical activity:    Days per week: Not on file    Minutes per session: Not on file  . Stress: Not on file  Relationships  . Social connections:    Talks on phone: Not on file    Gets together: Not on file    Attends religious service: Not on file    Active member of club or organization: Not on  file    Attends meetings of clubs or organizations: Not on file    Relationship status: Not on file  . Intimate partner violence:    Fear of current or ex partner: Not on file    Emotionally abused: Not on file    Physically abused: Not on file    Forced sexual activity: Not on file  Other Topics Concern  . 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 Arthritis Mother   . Hyperlipidemia Mother   . Heart disease Mother        Valve problem and CHF  . Hypertension Mother   . Diabetes Mother   . Hyperlipidemia Father   . Heart disease Father   . Stroke Father        CVA   . Pulmonary embolism Sister   . Colon cancer Neg Hx   . Pancreatic cancer Neg Hx   . Stomach cancer Neg Hx   . Colon polyps Neg Hx   . Rectal cancer Neg Hx    No Known Allergies  Medication list has been reviewed and updated.   General: Denies fever, chills, sweats. No significant weight loss. Eyes: Denies blurring,significant itching ENT: Denies earache, sore throat, and hoarseness. Cardiovascular: Denies chest pains, palpitations, dyspnea on exertion Respiratory: Denies cough, dyspnea at rest,wheeezing Breast: no concerns about lumps GI: Denies nausea, vomiting, diarrhea, constipation, change in bowel habits, abdominal pain, melena, hematochezia GU: Denies penile discharge, ED, urinary flow / outflow problems. No STD concerns. Musculoskeletal: Denies back pain, joint pain Derm: Denies rash, itching Neuro: Denies  paresthesias, frequent falls, frequent headaches Psych: Denies depression, anxiety Endocrine: Denies cold intolerance, heat intolerance, polydipsia Heme: Denies enlarged lymph nodes Allergy: No hayfever  Objective:   BP 130/84   Pulse 75   Temp 98.3 F (36.8 C) (Oral)   Ht _0  (1.753 m)   Wt 224 lb 8 oz (101.8 kg)   BMI 33.15  kg/m  Ideal Body Weight: Weight in (lb) to have BMI = 25: 168.9  No exam data present  GEN: well developed, well nourished, no acute distress Eyes: conjunctiva and lids normal, PERRLA, EOMI ENT: TM clear, nares clear, oral exam WNL Neck: supple, no lymphadenopathy, no thyromegaly, no JVD Pulm: clear to auscultation and percussion, respiratory effort normal CV: regular rate and rhythm, S1-S2, no murmur, rub or gallop, no bruits, peripheral pulses normal and symmetric, no cyanosis, clubbing, edema or varicosities GI: soft, non-tender; no hepatosplenomegaly, masses; active bowel sounds all quadrants GU: no hernia, testicular mass, penile discharge Lymph:  no cervical, axillary or inguinal adenopathy MSK: gait normal, muscle tone and strength WNL, no joint swelling, effusions, discoloration, crepitus  SKIN: clear, good turgor, color WNL, no rashes, lesions, or ulcerations Neuro: normal mental status, normal strength, sensation, and motion Psych: alert; oriented to person, place and time, normally interactive and not anxious or depressed in appearance. All labs reviewed with patient.  Lipids: Lab Results  Component Value Date   CHOL 160 04/11/2018   Lab Results  Component Value Date   HDL 29.90 (L) 04/11/2018   Lab Results  Component Value Date   LDLCALC 93 01/14/2016   Lab Results  Component Value Date   TRIG 221.0 (H) 04/11/2018   Lab Results  Component Value Date   CHOLHDL 5 04/11/2018   CBC: CBC Latest Ref Rng & Units 04/11/2018 02/04/2017 11/13/2016  WBC 4.0 - 10.5 K/uL 8.1 7.7 9.6  Hemoglobin 13.0 - 17.0 g/dL 14.3 14.6 14.4  Hematocrit 39.0 - 52.0 % 42.1 43.7 41.7  Platelets 150.0 - 400.0 K/uL 311.0 283.0 268    Basic Metabolic Panel:    Component Value Date/Time   NA 139 04/11/2018 0804   K 3.7 04/11/2018 0804   CL 104 04/11/2018 0804   CO2 27 04/11/2018 0804   BUN 12 04/11/2018 0804   CREATININE 0.98 04/11/2018 0804   GLUCOSE 124 (H) 04/11/2018 0804   CALCIUM  9.1 04/11/2018 0804   Hepatic Function Latest Ref Rng & Units 04/11/2018 02/04/2017 01/14/2016  Total Protein 6.0 - 8.3 g/dL 6.9 6.7 6.6  Albumin 3.5 - 5.2 g/dL 4.1 4.1 4.0  AST 0 - 37 U/L _0 ALT 0 - 53 U/L _1 Alk Phosphatase 39 - 117 U/L 88 85 76  Total Bilirubin 0.2 - 1.2 mg/dL 0.5 0.6 0.4  Bilirubin, Direct 0.0 - 0.3 mg/dL 0.1 0.1 0.1    Lab Results  Component Value Date   HGBA1C 6.8 (H) 04/11/2018   Lab Results  Component Value Date   TSH 1.33 08/01/2015   Lab Results  Component Value Date   PSA 0.66 02/04/2017   PSA 0.90 01/14/2016   PSA 0.66 11/20/2014    Assessment and Plan:   Routine general medical examination at a health care facility  Type 2 diabetes mellitus without complication, without long-term current use of insulin ([place]) - Plan: Ambulatory referral to diabetic education, Pneumococcal polysaccharide vaccine 23-valent greater than or equal to 2yo subcutaneous/IM  Need for prophylactic vaccination against Streptococcus pneumoniae (pneumococcus) - Plan: Pneumococcal polysaccharide vaccine 23-valent greater than or equal to 2yo subcutaneous/IM   >10 minutes spent in face to face time with patient, >50% spent in counselling or coordination of care: Greater than 10 minutes alone spent on counseling regarding new onset diabetes, diet changes, management.  For now organ to try a trial of diet controlled and lifestyle changes.  Have him go to diabetic education and follow-up with me in 6 months.  Health Maintenance Exam: The patient's preventative maintenance and recommended screening tests for an annual wellness exam were reviewed in full today. Brought up to date unless services declined.  Counselled on the importance of diet, exercise, and its role in overall health and mortality. The patient's FH and SH was reviewed, including their home life, tobacco status, and drug and alcohol status.  Follow-up in 1 year for physical exam or additional follow-up  below.  Patient Instructions   [facility] Low Glycemic Diet (Source: [facility], 2006)  Low Glycemic  Foods (20-49) (Decrease risk of developing heart disease)  Best for Diabetes: Eat Mostly these  Breakfast Cereals: All-Bran All-Bran Fruit 'n Oats Fiber One Oatmeal (not instant) Oat bran  Fruits and fruit juices: (Limit to 1-2 servings per day) Apples Apricots (fresh & dried) Blackberries Blueberries Cherries Cranberries Peaches Pears Plums Prunes Grapefruit Raspberries Strawberries Tangerine  Juices: Apple juice Grapefruit juice Tomato juice  Beans and legumes (fresh-cooked): Black-eyed peas Butter beans Chick peas Lentils  Green beans Lima beans Kidney beans Navy beans Pinto beans Snow peas  Non-starchy vegetables: Asparagus, avocado, broccoli, cabbage, cauliflower, celery, cucumber, greens, lettuce, mushrooms, peppers, tomatoes, okra, onions, spinach, summer squash  Grains: Barley Bulgur Rye Wild rice  Nuts and oils : Almonds Peanuts Sunflower seeds Hazelnuts Pecans Walnuts Oils that are liquid at room temperature  Dairy, fish, meat, soy, and eggs: Milk, skim Lowfat cheese Yogurt, lowfat, fruit sugar sweetened Lean red meat Fish  Skinless chicken & [place] Shellfish Egg whites (up to 3 daily) Soy products  Egg yolks (up to 7 or _____ per week) Moderate Glycemic Foods (50-69)  OK sometimes with diabetes  Breakfast Cereals: Bran Buds Bran Chex Just Right Mini-Wheats  Special K Swiss muesli  Fruits: Banana (under-ripe) Dates Figs Grapes Kiwi Mango Oranges Raisins  Fruit Juices: Cranberry juice Orange juice  Beans and legumes: [place]-type baked beans Canned pinto, kidney, or navy beans Green peas  Vegetables: Beets Carrots  Sweet potato Yam Corn on the cob  Breads: Pita (pocket) bread Oat bran bread Pumpernickel bread Rye bread Wheat bread, high fiber   Grains: Cornmeal Rice, brown Rice, white  Couscous  Pasta: Macaroni Pizza, cheese Ravioli, meat filled Spaghetti, white   Nuts: Cashews Macadamia  Snacks: Chocolate Ice cream, lowfat Muffin Popcorn High Glycemic Foods (70-100)  Rare: Eat occaisionally with diabetes  THESE ARE THE WORST KIND OF FOODS FOR YOUR DIABETES  Breakfast Cereals: Cheerios Corn Chex Corn Flakes Cream of Wheat Grape Nuts Grape Nut Flakes Grits Nutri-Grain Puffed Rice Puffed Wheat Rice Chex Rice Krispies Shredded Wheat Team Total  Fruits: Pineapple Watermelon Banana (over-ripe) Beverages: Sodas, sweet tea, pineapple juice  Vegetables: Potato, baked, boiled, fried, mashed [place] fries Canned or frozen corn Parsnips Winter squash  Breads: Most breads (white and whole grain) Bagels Bread sticks Bread stuffing Kaiser roll Dinner rolls  Grains: Rice, instant Tapioca, with milk Candy and most cookies  Snacks: Donuts Corn chips Jelly beans Pretzels Pastries        Follow-up: Return in about 6 months (around 10/18/2018) for DM recheck. Or follow-up in 1 year if not noted.  Signed,  Maud Deed. Dali Kraner, MD   Allergies as of 04/19/2018   No Known Allergies     Medication List       Accurate as of April 19, 2018 10:39 AM. Always use your most recent med list.        aspirin 81 MG tablet Take 81 mg by mouth daily.   atorvastatin 40 MG tablet Commonly known as:  LIPITOR TAKE 1 TABLET BY MOUTH DAILY AT 6 PM.   losartan 100 MG tablet Commonly known as:  COZAAR Take 0.5 tablets (50 mg total) by mouth daily.   multivitamin tablet Take 1 tablet by mouth daily.   pantoprazole 40 MG tablet Commonly known as:  PROTONIX Take 40 mg by mouth daily.

## 2018-04-19 ENCOUNTER — Ambulatory Visit (INDEPENDENT_AMBULATORY_CARE_PROVIDER_SITE_OTHER): Payer: 59 | Admitting: Family Medicine

## 2018-04-19 ENCOUNTER — Encounter: Payer: Self-pay | Admitting: Family Medicine

## 2018-04-19 VITALS — BP 130/84 | HR 75 | Temp 98.3°F | Ht 69.0 in | Wt 224.5 lb

## 2018-04-19 DIAGNOSIS — E119 Type 2 diabetes mellitus without complications: Secondary | ICD-10-CM | POA: Diagnosis not present

## 2018-04-19 DIAGNOSIS — Z23 Encounter for immunization: Secondary | ICD-10-CM | POA: Diagnosis not present

## 2018-04-19 DIAGNOSIS — Z0000 Encounter for general adult medical examination without abnormal findings: Secondary | ICD-10-CM

## 2018-04-19 NOTE — Patient Instructions (Signed)

## 2018-05-19 ENCOUNTER — Other Ambulatory Visit: Payer: Self-pay | Admitting: Family Medicine

## 2018-05-24 ENCOUNTER — Other Ambulatory Visit: Payer: Self-pay | Admitting: *Deleted

## 2018-05-24 MED ORDER — LOSARTAN POTASSIUM 50 MG PO TABS: 50.0000 mg | ORAL_TABLET | Freq: Every day | ORAL | 3 refills | Status: DC

## 2018-06-21 ENCOUNTER — Other Ambulatory Visit: Payer: Self-pay

## 2018-06-21 ENCOUNTER — Emergency Department (HOSPITAL_COMMUNITY): Payer: 59

## 2018-06-21 ENCOUNTER — Encounter (HOSPITAL_COMMUNITY): Payer: Self-pay

## 2018-06-21 ENCOUNTER — Emergency Department (HOSPITAL_COMMUNITY)
Admission: EM | Admit: 2018-06-21 | Discharge: 2018-06-21 | Disposition: A | Discharge status: Home / Self Care | Payer: 59 | Attending: Emergency Medicine | Admitting: Emergency Medicine

## 2018-06-21 DIAGNOSIS — I10 Essential (primary) hypertension: Secondary | ICD-10-CM | POA: Insufficient documentation

## 2018-06-21 DIAGNOSIS — Z79899 Other long term (current) drug therapy: Secondary | ICD-10-CM | POA: Diagnosis not present

## 2018-06-21 DIAGNOSIS — R1031 Right lower quadrant pain: Secondary | ICD-10-CM | POA: Diagnosis present

## 2018-06-21 DIAGNOSIS — Z7982 Long term (current) use of aspirin: Secondary | ICD-10-CM | POA: Insufficient documentation

## 2018-06-21 DIAGNOSIS — E119 Type 2 diabetes mellitus without complications: Secondary | ICD-10-CM | POA: Insufficient documentation

## 2018-06-21 DIAGNOSIS — N201 Calculus of ureter: Secondary | ICD-10-CM | POA: Insufficient documentation

## 2018-06-21 DIAGNOSIS — Z87891 Personal history of nicotine dependence: Secondary | ICD-10-CM | POA: Insufficient documentation

## 2018-06-21 LAB — CBC WITH DIFFERENTIAL/PLATELET
Abs Immature Granulocytes: 0.04 10*3/uL (ref 0.00–0.07)
Basophils Absolute: 0.1 10*3/uL (ref 0.0–0.1)
Basophils Relative: 1 %
Eosinophils Absolute: 0.2 10*3/uL (ref 0.0–0.5)
Eosinophils Relative: 2 %
HCT: 47.7 % (ref 39.0–52.0)
Hemoglobin: 15.3 g/dL (ref 13.0–17.0)
Immature Granulocytes: 0 %
Lymphocytes Relative: 23 %
Lymphs Abs: 2.9 10*3/uL (ref 0.7–4.0)
MCH: 28.7 pg (ref 26.0–34.0)
MCHC: 32.1 g/dL (ref 30.0–36.0)
MCV: 89.3 fL (ref 80.0–100.0)
Monocytes Absolute: 0.7 10*3/uL (ref 0.1–1.0)
Monocytes Relative: 5 %
Neutro Abs: 8.7 10*3/uL — ABNORMAL HIGH (ref 1.7–7.7)
Neutrophils Relative %: 69 %
Platelets: 292 10*3/uL (ref 150–400)
RBC: 5.34 MIL/uL (ref 4.22–5.81)
RDW: 13 % (ref 11.5–15.5)
WBC: 12.5 10*3/uL — ABNORMAL HIGH (ref 4.0–10.5)
nRBC: 0 % (ref 0.0–0.2)

## 2018-06-21 LAB — URINALYSIS, MICROSCOPIC (REFLEX)

## 2018-06-21 LAB — COMPREHENSIVE METABOLIC PANEL
ALT: 36 U/L (ref 0–44)
AST: 23 U/L (ref 15–41)
Albumin: 4.3 g/dL (ref 3.5–5.0)
Alkaline Phosphatase: 116 U/L (ref 38–126)
Anion gap: 12 (ref 5–15)
BUN: 14 mg/dL (ref 6–20)
CO2: 23 mmol/L (ref 22–32)
Calcium: 8.9 mg/dL (ref 8.9–10.3)
Chloride: 104 mmol/L (ref 98–111)
Creatinine, Ser: 1.11 mg/dL (ref 0.61–1.24)
GFR calc Af Amer: >60 mL/min (ref >60)
GFR calc non Af Amer: >60 mL/min (ref >60)
Glucose, Bld: 170 mg/dL — ABNORMAL HIGH (ref 70–99)
Potassium: 3.3 mmol/L — ABNORMAL LOW (ref 3.5–5.1)
Sodium: 139 mmol/L (ref 135–145)
Total Bilirubin: 0.8 mg/dL (ref 0.3–1.2)
Total Protein: 7.7 g/dL (ref 6.5–8.1)

## 2018-06-21 LAB — URINALYSIS, ROUTINE W REFLEX MICROSCOPIC
Bilirubin Urine: NEGATIVE
Glucose, UA: NEGATIVE mg/dL
Ketones, ur: 15 mg/dL — AB
Leukocytes,Ua: NEGATIVE
Nitrite: NEGATIVE
Specific Gravity, Urine: 1.025 (ref 1.005–1.030)
pH: 6 (ref 5.0–8.0)

## 2018-06-21 LAB — LIPASE, BLOOD: Lipase: 30 U/L (ref 11–51)

## 2018-06-21 MED ORDER — SODIUM CHLORIDE 0.9 % IV BOLUS
1000.0000 mL | Freq: Once | INTRAVENOUS | Status: AC
  Administered 2018-06-21: 1000 mL via INTRAVENOUS

## 2018-06-21 MED ORDER — OXYCODONE-ACETAMINOPHEN 5-325 MG PO TABS: 1.0000 | ORAL_TABLET | Freq: Four times a day (QID) | ORAL | 0 refills | Status: DC | PRN

## 2018-06-21 MED ORDER — ONDANSETRON HCL 4 MG PO TABS: 4.0000 mg | ORAL_TABLET | Freq: Three times a day (TID) | ORAL | 0 refills | Status: DC | PRN

## 2018-06-21 MED ORDER — ONDANSETRON HCL 4 MG/2ML IJ SOLN
4.0000 mg | Freq: Once | INTRAMUSCULAR | Status: AC
  Administered 2018-06-21: 4 mg via INTRAVENOUS
  Filled 2018-06-21: qty 2

## 2018-06-21 MED ORDER — KETOROLAC TROMETHAMINE 30 MG/ML IJ SOLN
30.0000 mg | Freq: Once | INTRAMUSCULAR | Status: AC
  Administered 2018-06-21: 30 mg via INTRAVENOUS
  Filled 2018-06-21: qty 1

## 2018-06-21 MED ORDER — MORPHINE SULFATE (PF) 4 MG/ML IV SOLN
4.0000 mg | Freq: Once | INTRAVENOUS | Status: AC
  Administered 2018-06-21: 4 mg via INTRAVENOUS
  Filled 2018-06-21: qty 1

## 2018-06-21 MED ORDER — TAMSULOSIN HCL 0.4 MG PO CAPS: 0.4000 mg | ORAL_CAPSULE | Freq: Every day | ORAL | 0 refills | Status: DC

## 2018-06-21 NOTE — ED Notes (Signed)
Patient given PO fluids

## 2018-06-21 NOTE — ED Triage Notes (Signed)
Patient having R sided flank pain radiates R groin x 1 hour. Denies urinary S&S. Nausea, no Vomiting.

## 2018-06-21 NOTE — ED Notes (Signed)
Bed: RZ73 Expected date:  Expected time:  Means of arrival:  Comments: EMS 58 yo right flank pain

## 2018-06-21 NOTE — ED Notes (Signed)
Patient transported to CT 

## 2018-06-21 NOTE — Discharge Instructions (Signed)
You were seen in the emergency department for right flank pain.  You had blood work urinalysis and a CAT scan that showed you have a 2 mm kidney stone in the ureter close to the bladder.  This will likely pass on its own.  We are prescribing you some pain and nausea medication along with Flomax which may help dilate the tube and make passing the stone more likely.  If you experience pain that is uncontrollable or any high fevers she should return to the emergency department.  We are providing you with number for urology if your symptoms continue.

## 2018-06-21 NOTE — ED Notes (Signed)
Patient back from CT.

## 2018-06-21 NOTE — ED Provider Notes (Signed)
[facility] DEPT Provider Note   CSN: [id number] Arrival date & time: 06/21/18  1004    History   Chief Complaint Chief Complaint  Patient presents with   Flank Pain    HPI Willie Johnson is a 58 y.o. male.  He is complaining of acute onset of right flank pain radiating to right groin started about an hour prior to arrival.  It is severe and burning in nature.  It is associated with a little bit of nausea but no vomiting.  No fevers or chills no chest pain no shortness of breath.  Get to normal alignment.  No urinary symptoms.  No trauma and no history of similar pain in the past.  He has tried nothing for it.     The history is provided by the patient.  Flank Pain  This is a new problem. The current episode started 1 to 2 hours ago. The problem occurs constantly. The problem has not changed since onset.Associated symptoms include abdominal pain. Pertinent negatives include no chest pain, no headaches and no shortness of breath. Nothing aggravates the symptoms. Nothing relieves the symptoms. He has tried nothing for the symptoms. The treatment provided no relief.    Past Medical History:  Diagnosis Date   Allergic rhinitis due to pollen    Allergy    Diverticulosis    GERD (gastroesophageal reflux disease)    HTN (hypertension)    Hyperlipidemia    Type 2 diabetes mellitus without complication, without long-term current use of insulin ([place]) 04/16/2018    Patient Active Problem List   Diagnosis Date Noted   Type 2 diabetes mellitus without complication, without long-term current use of insulin ([place]) 04/16/2018   Essential hypertension 11/30/2016   Hyperlipidemia     Past Surgical History:  Procedure Laterality Date   COLONOSCOPY     FRACTURE SURGERY     hand- scafoid muscle    KNEE SURGERY          Home Medications    Prior to Admission medications   Medication Sig Start Date End Date Taking? Authorizing Provider    aspirin 81 MG tablet Take 81 mg by mouth daily.    [provider]  atorvastatin (LIPITOR) 40 MG tablet TAKE 1 TABLET BY MOUTH DAILY AT 6 PM. Patient taking differently: Take 40 mg by mouth daily at 6 PM.  03/20/18   Minus Breeding, MD  losartan (COZAAR) 50 MG tablet Take 1 tablet (50 mg total) by mouth daily. 05/24/18   Copland, Frederico Hamman, MD  Multiple Vitamin (MULTIVITAMIN) tablet Take 1 tablet by mouth daily.    [provider]  pantoprazole (PROTONIX) 40 MG tablet Take 40 mg by mouth daily.    [provider]    Family History Family History  Problem Relation Age of Onset   Arthritis Mother    Hyperlipidemia Mother    Heart disease Mother        Valve problem and CHF   Hypertension Mother    Diabetes Mother    Hyperlipidemia Father    Heart disease Father    Stroke Father        CVA    Pulmonary embolism Sister    Colon cancer Neg Hx    Pancreatic cancer Neg Hx    Stomach cancer Neg Hx    Colon polyps Neg Hx    Rectal cancer Neg Hx     Social History Social History   Tobacco Use   Smoking status:  Former Smoker    Packs/day: 0.50    Years: 20.00    Pack years: 10.00    Last attempt to quit: 07/01/2017    Years since quitting: 0.9   Smokeless tobacco: Never Used   Tobacco comment: no vapes   Substance Use Topics   Alcohol use: No    Alcohol/week: 0.0 standard drinks   Drug use: No     Allergies   Patient has no known allergies.   Review of Systems Review of Systems  Constitutional: Negative for chills and fever.  HENT: Negative for ear pain and sore throat.   Eyes: Negative for visual disturbance.  Respiratory: Negative for cough and shortness of breath.   Cardiovascular: Negative for chest pain and palpitations.  Gastrointestinal: Positive for abdominal pain and nausea. Negative for vomiting.  Genitourinary: Positive for flank pain. Negative for dysuria and hematuria.  Musculoskeletal: Positive for back pain.   Skin: Negative for rash.  Neurological: Negative for syncope and headaches.  All other systems reviewed and are negative.    Physical Exam Updated Vital Signs BP (!) 159/98    Pulse 67    Temp 98.8 F (37.1 C) (Oral)    Resp 20    Ht 5\' 9"  (1.753 m)    Wt 92.5 kg    SpO2 97%    BMI 30.13 kg/m   Physical Exam Vitals signs and nursing note reviewed.  Constitutional:      Appearance: He is well-developed.  HENT:     Head: Normocephalic and atraumatic.  Eyes:     Conjunctiva/sclera: Conjunctivae normal.  Neck:     Musculoskeletal: Neck supple.  Cardiovascular:     Rate and Rhythm: Normal rate and regular rhythm.     Heart sounds: No murmur.  Pulmonary:     Effort: Pulmonary effort is normal. No respiratory distress.     Breath sounds: Normal breath sounds.  Abdominal:     Palpations: Abdomen is soft.     Tenderness: There is no abdominal tenderness.  Musculoskeletal: Normal range of motion.     Right lower leg: No edema.     Left lower leg: No edema.  Skin:    General: Skin is warm and dry.     Capillary Refill: Capillary refill takes less than 2 seconds.  Neurological:     General: No focal deficit present.     Mental Status: He is alert and oriented to person, place, and time.     Sensory: No sensory deficit.     Motor: No weakness.      ED Treatments / Results  Labs (all labs ordered are listed, but only abnormal results are displayed) Labs Reviewed  COMPREHENSIVE METABOLIC PANEL - Abnormal; Notable for the following components:      Result Value   Potassium 3.3 (*)    Glucose, Bld 170 (*)    All other components within normal limits  CBC WITH DIFFERENTIAL/PLATELET - Abnormal; Notable for the following components:   WBC 12.5 (*)    Neutro Abs 8.7 (*)    All other components within normal limits  URINALYSIS, ROUTINE W REFLEX MICROSCOPIC - Abnormal; Notable for the following components:   Hgb urine dipstick TRACE (*)    Ketones, ur 15 (*)    Protein, ur  TRACE (*)    All other components within normal limits  URINALYSIS, MICROSCOPIC (REFLEX) - Abnormal; Notable for the following components:   Bacteria, UA RARE (*)    All other components within normal  limits  LIPASE, BLOOD    EKG None  Radiology Ct Renal Stone Study  Result Date: 06/21/2018 CLINICAL DATA:  Acute onset of right-sided flank pain and right groin pain this morning. EXAM: CT ABDOMEN AND PELVIS WITHOUT CONTRAST TECHNIQUE: Multidetector CT imaging of the abdomen and pelvis was performed following the standard protocol without IV contrast. COMPARISON:  CT scan dated 09/30/2015 FINDINGS: Lower chest: Normal. Hepatobiliary: Single small calcified granuloma in the right lobe liver. Liver parenchyma is otherwise normal. Biliary tree is normal. Pancreas: Unremarkable. No pancreatic ductal dilatation or surrounding inflammatory changes. Spleen: Normal in size without focal abnormality. Adrenals/Urinary Tract: Patient has a congenital horseshoe kidney. There is hydronephrosis of the right side of the horseshoe kidney with slight perinephric soft tissue stranding on the right. The ureter is dilated to the level of the ureterovesicle junction where there is a 2 mm stone obstructing the ureter. The left side of the horseshoe kidney is normal. Bladder is normal. Stomach/Bowel: Diverticulosis in the ascending and sigmoid portions of the colon. The bowel is otherwise normal including the terminal ileum and appendix. Vascular/Lymphatic: Minimal aortic atherosclerosis.  No adenopathy. Reproductive: Prostate is unremarkable. Other: No abdominal wall hernia or abnormality. No abdominopelvic ascites. Musculoskeletal: No acute or significant osseous findings. IMPRESSION: 1. 2 mm stone obstructing the distal right ureter at the ureterovesical junction creating mild hydronephrosis of the right side of the horseshoe kidney. 2. No visible renal calculi. 3. Diverticulosis of the colon. Electronically Signed   By:  Lorriane Shire M.D.   On: 06/21/2018 11:20    Procedures Procedures (including critical care time)  Medications Ordered in ED Medications  morphine 4 MG/ML injection 4 mg (has no administration in time range)  ketorolac (TORADOL) 30 MG/ML injection 30 mg (has no administration in time range)  ondansetron (ZOFRAN) injection 4 mg (has no administration in time range)  sodium chloride 0.9 % bolus 1,000 mL (has no administration in time range)     Initial Impression / Assessment and Plan / ED Course  I have reviewed the triage vital signs and the nursing notes.  Pertinent labs & imaging results that were available during my care of the patient were reviewed by me and considered in my medical decision making (see chart for details).  Clinical Course as of Jun 21 1527  Wed Jun 21, 2018  1018 Differential diagnosis includes biliary colic, ureteral colic, obstruction, dissection, musculoskeletal.   [MB]  1126 Pain improved with pain medicine.  CT shows a 2 mm distal stone.  Patient was already aware of horseshoe kidney.   [MB]    Clinical Course User Index [MB] Hayden Rasmussen, MD        Final Clinical Impressions(s) / ED Diagnoses   Final diagnoses:  Ureterolithiasis    ED Discharge Orders         Ordered    oxyCODONE-acetaminophen (PERCOCET/ROXICET) 5-325 MG tablet  Every 6 hours PRN     06/21/18 1203    ondansetron (ZOFRAN) 4 MG tablet  Every 8 hours PRN     06/21/18 1203    tamsulosin (FLOMAX) 0.4 MG CAPS capsule  Daily     06/21/18 1203           Hayden Rasmussen, MD 06/21/18 1530

## 2018-06-22 ENCOUNTER — Emergency Department (HOSPITAL_COMMUNITY)
Admission: EM | Admit: 2018-06-22 | Discharge: 2018-06-22 | Disposition: A | Discharge status: Home / Self Care | Payer: 59 | Attending: Emergency Medicine | Admitting: Emergency Medicine

## 2018-06-22 ENCOUNTER — Other Ambulatory Visit: Payer: Self-pay

## 2018-06-22 ENCOUNTER — Telehealth: Payer: 59 | Admitting: Physician Assistant

## 2018-06-22 ENCOUNTER — Encounter (HOSPITAL_COMMUNITY): Payer: Self-pay

## 2018-06-22 ENCOUNTER — Emergency Department (HOSPITAL_COMMUNITY): Payer: 59

## 2018-06-22 DIAGNOSIS — Z79899 Other long term (current) drug therapy: Secondary | ICD-10-CM | POA: Diagnosis not present

## 2018-06-22 DIAGNOSIS — Z87891 Personal history of nicotine dependence: Secondary | ICD-10-CM | POA: Insufficient documentation

## 2018-06-22 DIAGNOSIS — E119 Type 2 diabetes mellitus without complications: Secondary | ICD-10-CM | POA: Insufficient documentation

## 2018-06-22 DIAGNOSIS — N200 Calculus of kidney: Secondary | ICD-10-CM

## 2018-06-22 DIAGNOSIS — I10 Essential (primary) hypertension: Secondary | ICD-10-CM | POA: Diagnosis not present

## 2018-06-22 DIAGNOSIS — N179 Acute kidney failure, unspecified: Secondary | ICD-10-CM | POA: Diagnosis not present

## 2018-06-22 DIAGNOSIS — R339 Retention of urine, unspecified: Secondary | ICD-10-CM | POA: Diagnosis present

## 2018-06-22 DIAGNOSIS — R34 Anuria and oliguria: Secondary | ICD-10-CM

## 2018-06-22 DIAGNOSIS — E860 Dehydration: Secondary | ICD-10-CM | POA: Insufficient documentation

## 2018-06-22 LAB — COMPREHENSIVE METABOLIC PANEL
ALT: 29 U/L (ref 0–44)
AST: 22 U/L (ref 15–41)
Albumin: 4 g/dL (ref 3.5–5.0)
Alkaline Phosphatase: 97 U/L (ref 38–126)
Anion gap: 10 (ref 5–15)
BUN: 18 mg/dL (ref 6–20)
CO2: 23 mmol/L (ref 22–32)
Calcium: 8.6 mg/dL — ABNORMAL LOW (ref 8.9–10.3)
Chloride: 95 mmol/L — ABNORMAL LOW (ref 98–111)
Creatinine, Ser: 1.61 mg/dL — ABNORMAL HIGH (ref 0.61–1.24)
GFR calc Af Amer: 55 mL/min — ABNORMAL LOW (ref >60)
GFR calc non Af Amer: 47 mL/min — ABNORMAL LOW (ref >60)
Glucose, Bld: 123 mg/dL — ABNORMAL HIGH (ref 70–99)
Potassium: 3.6 mmol/L (ref 3.5–5.1)
Sodium: 128 mmol/L — ABNORMAL LOW (ref 135–145)
Total Bilirubin: 1.1 mg/dL (ref 0.3–1.2)
Total Protein: 7 g/dL (ref 6.5–8.1)

## 2018-06-22 LAB — CBC WITH DIFFERENTIAL/PLATELET
Abs Immature Granulocytes: 0.1 10*3/uL — ABNORMAL HIGH (ref 0.00–0.07)
Basophils Absolute: 0 10*3/uL (ref 0.0–0.1)
Basophils Relative: 0 %
Eosinophils Absolute: 0 10*3/uL (ref 0.0–0.5)
Eosinophils Relative: 0 %
HCT: 39.1 % (ref 39.0–52.0)
Hemoglobin: 13.3 g/dL (ref 13.0–17.0)
Immature Granulocytes: 1 %
Lymphocytes Relative: 6 %
Lymphs Abs: 1.2 10*3/uL (ref 0.7–4.0)
MCH: 29.6 pg (ref 26.0–34.0)
MCHC: 34 g/dL (ref 30.0–36.0)
MCV: 87.1 fL (ref 80.0–100.0)
Monocytes Absolute: 1.2 10*3/uL — ABNORMAL HIGH (ref 0.1–1.0)
Monocytes Relative: 6 %
Neutro Abs: 16.3 10*3/uL — ABNORMAL HIGH (ref 1.7–7.7)
Neutrophils Relative %: 87 %
Platelets: 251 10*3/uL (ref 150–400)
RBC: 4.49 MIL/uL (ref 4.22–5.81)
RDW: 13.2 % (ref 11.5–15.5)
WBC: 18.8 10*3/uL — ABNORMAL HIGH (ref 4.0–10.5)
nRBC: 0 % (ref 0.0–0.2)

## 2018-06-22 LAB — URINALYSIS, ROUTINE W REFLEX MICROSCOPIC
Bacteria, UA: NONE SEEN
Bilirubin Urine: NEGATIVE
Glucose, UA: NEGATIVE mg/dL
Ketones, ur: 20 mg/dL — AB
Leukocytes,Ua: NEGATIVE
Nitrite: NEGATIVE
Protein, ur: NEGATIVE mg/dL
Specific Gravity, Urine: 1.013 (ref 1.005–1.030)
pH: 6 (ref 5.0–8.0)

## 2018-06-22 MED ORDER — MORPHINE SULFATE (PF) 4 MG/ML IV SOLN
4.0000 mg | Freq: Once | INTRAVENOUS | Status: DC
  Filled 2018-06-22: qty 1

## 2018-06-22 MED ORDER — SODIUM CHLORIDE 0.9 % IV BOLUS
1000.0000 mL | Freq: Once | INTRAVENOUS | Status: AC
  Administered 2018-06-22: 1000 mL via INTRAVENOUS

## 2018-06-22 MED ORDER — ONDANSETRON HCL 4 MG/2ML IJ SOLN
4.0000 mg | Freq: Once | INTRAMUSCULAR | Status: DC
  Filled 2018-06-22: qty 2

## 2018-06-22 MED ORDER — PROMETHAZINE HCL 25 MG PO TABS: 25.0000 mg | ORAL_TABLET | Freq: Three times a day (TID) | ORAL | 0 refills | Status: DC | PRN

## 2018-06-22 NOTE — Progress Notes (Signed)
Based on what you shared with me, I feel your condition warrants further evaluation and I recommend that you be seen for a face to face office visit.  There is significant concern for urinary obstruction from the stone. You need to return to ER ASAP for further management. Do not delay care.     NOTE: If you entered your credit card information for this eVisit, you will not be charged. You may see a "hold" on your card for the $35 but that hold will drop off and you will not have a charge processed.  If you are having a true medical emergency please call 911.  If you need an urgent face to face visit, [facility] has four urgent care centers for your convenience.    PLEASE NOTE: THE [facility] LOCATIONS AND URGENT CARE CLINICS DO NOT HAVE THE TESTING FOR CORONAVIRUS COVID19 AVAILABLE.  IF YOU FEEL YOU NEED THIS TEST YOU MUST GO TO A TRIAGE LOCATION AT [facility]   [email or web address] to reserve your spot online an avoid wait times  [facility] [address], [address] [place], [place] [address] Modified hours of operation: Monday-Friday, 10 AM to 6 PM  Saturday & Sunday 10 AM to 4 PM *Across the street from [place] (New Address!) [address] [place], [place] [address] *Just off [address], across the road from [place] hours of operation: Monday-Friday, 10 AM to 5 PM  Closed Saturday & Sunday   The following sites will take your insurance:  . [facility] [place] a Provider at this Location  [address] [place], [place] [address] . 10 am to 8 pm Monday-Friday . 12 pm to 8 pm Saturday-Sunday   . [facility] at [place] a Provider at this Location  [address], [place], [place] [address] . 8 am to 8 pm Monday-Friday . 9 am  to 6 pm Saturday . 11 am to 6 pm Sunday   . [facility] at [place] Get Driving Directions  [address] [place],  [address] . 8 am to 8 pm Monday-Friday . 8 am to 4 pm Saturday-Sunday   Your e-visit answers were reviewed by a board certified advanced clinical practitioner to complete your personal care plan.  Thank you for using e-Visits.

## 2018-06-22 NOTE — ED Notes (Signed)
Bladder scan attempted by this RN and Deatra Rainier NT . Unsuccessful at this time.

## 2018-06-22 NOTE — Discharge Instructions (Signed)
Make sure you are staying well-hydrated water.  Your urine should be clear to pale yellow. Continue taking Flomax daily. Continue using pain medication and antinausea medication as needed. Call the urology office tomorrow to set up an appointment for tomorrow. Return to the emergency room with any new, worsening, or concerning symptoms.

## 2018-06-22 NOTE — ED Notes (Signed)
Pt able to urinate without difficulty at this time.

## 2018-06-22 NOTE — ED Triage Notes (Signed)
Pt was seen here yesterday and dx with kidney stones. Pt was given meds and dc. PT states that since then he has had multiple episodes of emesis, and has been unable to void (only "trickles").

## 2018-06-22 NOTE — ED Provider Notes (Signed)
[facility] DEPT Provider Note   CSN: [id number] Arrival date & time: 06/22/18  1544    History   Chief Complaint Chief Complaint  Patient presents with   Urinary Retention   Emesis    HPI Willie Johnson is a 58 y.o. male presenting for evaluation of urinary retention and vomiting.  Patient states he was seen yesterday in the ED diagnosed with a kidney stone on the right side.  He was discharged with Flomax, pain medication, antinausea medication.  He has been taking them as prescribed, and trying to hydrate.  However, he reports multiple episodes of vomiting last night and today.  He reports a subjective fever last night.  Patient reports today he has had difficulty urinating, has had no desire to urinate and when he tries, he has to Valsalva or hard to get a trickle of urine.  This is acutely different from yesterday.  He denies previous kidney stones.  He denies chest pain, shortness of breath, cough.  Patient reports abdominal pain is intermittent, mostly of his right flank, low back, radiating to his anterior abdomen/suprapubic abd.      HPI  Past Medical History:  Diagnosis Date   Allergic rhinitis due to pollen    Allergy    Diverticulosis    GERD (gastroesophageal reflux disease)    HTN (hypertension)    Hyperlipidemia    Type 2 diabetes mellitus without complication, without long-term current use of insulin ([place]) 04/16/2018    Patient Active Problem List   Diagnosis Date Noted   Type 2 diabetes mellitus without complication, without long-term current use of insulin ([place]) 04/16/2018   Essential hypertension 11/30/2016   Hyperlipidemia     Past Surgical History:  Procedure Laterality Date   COLONOSCOPY     FRACTURE SURGERY     hand- scafoid muscle    KNEE SURGERY          Home Medications    Prior to Admission medications   Medication Sig Start Date End Date Taking? Authorizing Provider  atorvastatin  (LIPITOR) 40 MG tablet TAKE 1 TABLET BY MOUTH DAILY AT 6 PM. Patient taking differently: Take 40 mg by mouth daily at 6 PM.  03/20/18  Yes Hochrein, Jeneen Rinks, MD  losartan (COZAAR) 50 MG tablet Take 1 tablet (50 mg total) by mouth daily. 05/24/18  Yes Copland, Frederico Hamman, MD  Multiple Vitamin (MULTIVITAMIN) tablet Take 1 tablet by mouth daily.   Yes [provider]  ondansetron (ZOFRAN) 4 MG tablet Take 1 tablet (4 mg total) by mouth every 8 (eight) hours as needed for nausea or vomiting. 06/21/18  Yes Hayden Rasmussen, MD  oxyCODONE-acetaminophen (PERCOCET/ROXICET) 5-325 MG tablet Take 1-2 tablets by mouth every 6 (six) hours as needed for severe pain. 06/21/18  Yes Hayden Rasmussen, MD  pantoprazole (PROTONIX) 40 MG tablet Take 40 mg by mouth daily as needed (acid reflux).    Yes [provider]  tamsulosin (FLOMAX) 0.4 MG CAPS capsule Take 1 capsule (0.4 mg total) by mouth daily. 06/21/18  Yes Hayden Rasmussen, MD  promethazine (PHENERGAN) 25 MG tablet Take 1 tablet (25 mg total) by mouth every 8 (eight) hours as needed for nausea or vomiting. 06/22/18   Georgina Krist, PA-C    Family History Family History  Problem Relation Age of Onset   Arthritis Mother    Hyperlipidemia Mother    Heart disease Mother        Valve problem and CHF   Hypertension  Mother    Diabetes Mother    Hyperlipidemia Father    Heart disease Father    Stroke Father        CVA    Pulmonary embolism Sister    Colon cancer Neg Hx    Pancreatic cancer Neg Hx    Stomach cancer Neg Hx    Colon polyps Neg Hx    Rectal cancer Neg Hx     Social History Social History   Tobacco Use   Smoking status: Former Smoker    Packs/day: 0.50    Years: 20.00    Pack years: 10.00    Last attempt to quit: 07/01/2017    Years since quitting: 0.9   Smokeless tobacco: Never Used   Tobacco comment: no vapes   Substance Use Topics   Alcohol use: No    Alcohol/week: 0.0 standard drinks   Drug  use: No     Allergies   Patient has no known allergies.   Review of Systems Review of Systems  Constitutional: Positive for fever (subjective).  Gastrointestinal: Positive for abdominal pain, nausea and vomiting.  Genitourinary: Positive for difficulty urinating and flank pain.  All other systems reviewed and are negative.    Physical Exam Updated Vital Signs BP 138/72    Pulse 88    Temp 97.6 F (36.4 C) (Oral)    Resp 17    Ht 5\' 9"  (1.753 m)    Wt 92.5 kg    SpO2 98%    BMI 30.11 kg/m   Physical Exam Vitals signs and nursing note reviewed.  Constitutional:      General: He is not in acute distress.    Appearance: He is well-developed.     Comments: Appears nontoxic  HENT:     Head: Normocephalic and atraumatic.  Eyes:     Conjunctiva/sclera: Conjunctivae normal.     Pupils: Pupils are equal, round, and reactive to light.  Neck:     Musculoskeletal: Normal range of motion and neck supple.  Cardiovascular:     Rate and Rhythm: Normal rate and regular rhythm.  Pulmonary:     Effort: Pulmonary effort is normal. No respiratory distress.     Breath sounds: Normal breath sounds. No wheezing.  Abdominal:     General: There is no distension.     Palpations: Abdomen is soft. There is no mass.     Tenderness: There is no abdominal tenderness. There is no guarding or rebound.     Comments: Tenderness palpation of right low back and anterior abdomen.  No rigidity, guarding, distention.  Negative rebound.  Musculoskeletal: Normal range of motion.  Skin:    General: Skin is warm and dry.  Neurological:     Mental Status: He is alert and oriented to person, place, and time.      ED Treatments / Results  Labs (all labs ordered are listed, but only abnormal results are displayed) Labs Reviewed  CBC WITH DIFFERENTIAL/PLATELET - Abnormal; Notable for the following components:      Result Value   WBC 18.8 (*)    Neutro Abs 16.3 (*)    Monocytes Absolute 1.2 (*)    Abs  Immature Granulocytes 0.10 (*)    All other components within normal limits  COMPREHENSIVE METABOLIC PANEL - Abnormal; Notable for the following components:   Sodium 128 (*)    Chloride 95 (*)    Glucose, Bld 123 (*)    Creatinine, Ser 1.61 (*)    Calcium 8.6 (*)  GFR calc non Af Amer 47 (*)    GFR calc Af Amer 55 (*)    All other components within normal limits  URINALYSIS, ROUTINE W REFLEX MICROSCOPIC - Abnormal; Notable for the following components:   Hgb urine dipstick LARGE (*)    Ketones, ur 20 (*)    All other components within normal limits    EKG None  Radiology Ct Renal Stone Study  Result Date: 06/22/2018 CLINICAL DATA:  Kidney stones with inability to void. EXAM: CT ABDOMEN AND PELVIS WITHOUT CONTRAST TECHNIQUE: Multidetector CT imaging of the abdomen and pelvis was performed following the standard protocol without IV contrast. COMPARISON:  06/21/2018 FINDINGS: Lower chest: Dependent atelectasis bilaterally. Calcified granuloma posterior right costophrenic sulcus. Hepatobiliary: The liver shows diffusely decreased attenuation suggesting steatosis. There is no evidence for gallstones, gallbladder wall thickening, or pericholecystic fluid. No intrahepatic or extrahepatic biliary dilation. Pancreas: No focal mass lesion. No dilatation of the main duct. No intraparenchymal cyst. No peripancreatic edema. Spleen: No splenomegaly. No focal mass lesion. Adrenals/Urinary Tract: No adrenal nodule or mass. Horseshoe kidney anatomy again noted. There is stable mild distention of the collecting system and pelvis of the right moiety with interval progression of edema around the right renal moiety tracking anteriorly along the duodenum. Mild right hydroureter evident with a 1-2 mm stone evident in the right UVJ, similar to yesterday's study no evidence for left ureteral stone. No stone free within the urinary bladder Stomach/Bowel: Stomach is unremarkable. No gastric wall thickening. No  evidence of outlet obstruction. Duodenum is normally positioned as is the ligament of Treitz. No small bowel wall thickening. No small bowel dilatation. The terminal ileum is normal. The appendix is normal. No gross colonic mass. No colonic wall thickening. Diverticular changes are noted in the left colon without evidence of diverticulitis. Vascular/Lymphatic: There is abdominal aortic atherosclerosis without aneurysm. There is no gastrohepatic or hepatoduodenal ligament lymphadenopathy. No intraperitoneal or retroperitoneal lymphadenopathy. No pelvic sidewall lymphadenopathy. Reproductive: The prostate gland and seminal vesicles are unremarkable. Other: Trace free fluid noted in the pelvis. Musculoskeletal: No worrisome lytic or sclerotic osseous abnormality. IMPRESSION: 1. Interval increase in edema/inflammation surrounding the right renal moiety of the horseshoe kidney. There is persistent, but stable mild right hydroureteronephrosis with stable position of the 1-2 mm stone in the right UVJ. 2. Left colonic diverticulosis without diverticulitis. Electronically Signed   By: Misty Stanley M.D.   On: 06/22/2018 18:32   Ct Renal Stone Study  Result Date: 06/21/2018 CLINICAL DATA:  Acute onset of right-sided flank pain and right groin pain this morning. EXAM: CT ABDOMEN AND PELVIS WITHOUT CONTRAST TECHNIQUE: Multidetector CT imaging of the abdomen and pelvis was performed following the standard protocol without IV contrast. COMPARISON:  CT scan dated 09/30/2015 FINDINGS: Lower chest: Normal. Hepatobiliary: Single small calcified granuloma in the right lobe liver. Liver parenchyma is otherwise normal. Biliary tree is normal. Pancreas: Unremarkable. No pancreatic ductal dilatation or surrounding inflammatory changes. Spleen: Normal in size without focal abnormality. Adrenals/Urinary Tract: Patient has a congenital horseshoe kidney. There is hydronephrosis of the right side of the horseshoe kidney with slight  perinephric soft tissue stranding on the right. The ureter is dilated to the level of the ureterovesicle junction where there is a 2 mm stone obstructing the ureter. The left side of the horseshoe kidney is normal. Bladder is normal. Stomach/Bowel: Diverticulosis in the ascending and sigmoid portions of the colon. The bowel is otherwise normal including the terminal ileum and appendix. Vascular/Lymphatic: Minimal aortic atherosclerosis.  No adenopathy. Reproductive: Prostate is unremarkable. Other: No abdominal wall hernia or abnormality. No abdominopelvic ascites. Musculoskeletal: No acute or significant osseous findings. IMPRESSION: 1. 2 mm stone obstructing the distal right ureter at the ureterovesical junction creating mild hydronephrosis of the right side of the horseshoe kidney. 2. No visible renal calculi. 3. Diverticulosis of the colon. Electronically Signed   By: Lorriane Shire M.D.   On: 06/21/2018 11:20    Procedures Procedures (including critical care time)  Medications Ordered in ED Medications  ondansetron (ZOFRAN) injection 4 mg (4 mg Intravenous Refused 06/22/18 1639)  morphine 4 MG/ML injection 4 mg (4 mg Intravenous Refused 06/22/18 1639)  sodium chloride 0.9 % bolus 1,000 mL (1,000 mLs Intravenous New Bag/Given 06/22/18 1954)     Initial Impression / Assessment and Plan / ED Course  I have reviewed the triage vital signs and the nursing notes.  Pertinent labs & imaging results that were available during my care of the patient were reviewed by me and considered in my medical decision making (see chart for details).        Patient presenting for evaluation of difficulty urinating, subjective fevers, vomiting after being diagnosed with a kidney stone yesterday in the ED.  History is concerning for potential obstruction versus infection with a kidney stone.  However, initial exam reassuring, he appears nontoxic.  Will obtain repeat labs, bladder scan, and reassess.  Labs show  elevation of white count from 12-18.  Creatinine has also increased, 1.1-1.6.  UA reassuring, no infection.  Initial bladder scan unsuccessful.  Repeat bladder scan shows possibly 15 cc, however difficult to assess.  Will obtain repeat CT for further evaluation.  Repeat CT shows increased edema/inflammation around the right kidney.  Once again, demonstrates 1 to 2 mm stone at the UVJ.  Will consult with urology.  Discussed with Dr. Alyson Ingles from [facility], who recommends hydration in the ED, pain and nausea control, discharged with Phenergan, and follow-up in the office tomorrow.  Discussed with patient, who is agreeable to plan.  He is currently urinating without difficulty, and feels better.  At this time, patient appears safe for discharge.  Strict return precautions given.  Patient states he understands and agrees to plan.  Final Clinical Impressions(s) / ED Diagnoses   Final diagnoses:  Kidney stone  Dehydration  AKI (acute kidney injury) [facility])    ED Discharge Orders         Ordered    promethazine (PHENERGAN) 25 MG tablet  Every 8 hours PRN     06/22/18 1950           Franchot Heidelberg, PA-C 06/22/18 1957    Lacretia Leigh, MD 06/22/18 2249

## 2018-08-11 ENCOUNTER — Encounter: Payer: Self-pay | Admitting: Family Medicine

## 2018-09-15 ENCOUNTER — Other Ambulatory Visit: Payer: Self-pay | Admitting: Internal Medicine

## 2018-11-07 ENCOUNTER — Encounter: Payer: Self-pay | Admitting: Family Medicine

## 2018-11-07 DIAGNOSIS — E7800 Pure hypercholesterolemia, unspecified: Secondary | ICD-10-CM

## 2018-11-07 MED ORDER — ATORVASTATIN CALCIUM 40 MG PO TABS: ORAL_TABLET | ORAL | 1 refills | Status: DC

## 2018-11-27 ENCOUNTER — Ambulatory Visit: Payer: 59 | Admitting: Family Medicine

## 2018-12-03 NOTE — Progress Notes (Signed)
     Willie Johnson T. Willie Gluth, MD Primary Care and [place] at [facility] [address] [place], [address] Phone: [phone]  FAX: [place] - 58 y.o. male  MRN [id number]  Date of Birth: Sep 11, 1960  Visit Date: 12/04/2018  PCP: Owens Loffler, MD  Referred by: Owens Loffler, MD Chief Complaint  Patient presents with  . Diabetes   Virtual Visit via Video Note:  I connected with  Willie Johnson on 12/04/2018  9:00 AM EDT by a video enabled telemedicine application and verified that I am speaking with the correct person using two identifiers.   Location patient: home computer, tablet, or smartphone Location provider: work or home office Consent: Verbal consent directly obtained from [employer]. Persons participating in the virtual visit: patient, provider  I discussed the limitations of evaluation and management by telemedicine and the availability of in person appointments. The patient expressed understanding and agreed to proceed.  History of Present Illness:  Willie Johnson is a very well-known patient.  He calls today in follow-up of his diabetes.  He is essentially not having any problems at all, this is diet-controlled only.  His fasting blood sugars are roughly 100, and his postprandial blood sugars are approximately 1 20-1 30.  He feels very good and has lost 30 pounds.  Review of Systems as above: See pertinent positives and pertinent negatives per HPI No acute distress verbally  Past Medical History, Surgical History, Social History, Family History, Problem List, Medications, and Allergies have been reviewed and updated if relevant.   Observations/Objective/Exam:  An attempt was made to discern vital signs over the phone and per patient if applicable and possible.   General:    Alert, Oriented, appears well and in no acute distress HEENT:     Atraumatic, conjunctiva clear, no obvious abnormalities on  inspection of external nose and ears.  Neck:    Normal movements of the head and neck Pulmonary:     On inspection no signs of respiratory distress, breathing rate appears normal, no obvious gross SOB, gasping or wheezing Cardiovascular:    No obvious cyanosis Musculoskeletal:    Moves all visible extremities without noticeable abnormality Psych / Neurological:     Pleasant and cooperative, no obvious depression or anxiety, speech and thought processing grossly intact  Assessment and Plan:    ICD-10-CM   1. Type 2 diabetes mellitus without complication, without long-term current use of insulin ([place])  E11.9    I have applauded Willie Johnson for his 30 pound weight loss and good control of his diabetes management.  Follow-up in early winter for complete physical.  I discussed the assessment and treatment plan with the patient. The patient was provided an opportunity to ask questions and all were answered. The patient agreed with the plan and demonstrated an understanding of the instructions.   The patient was advised to call back or seek an in-person evaluation if the symptoms worsen or if the condition fails to improve as anticipated.  Follow-up: prn unless noted otherwise below No follow-ups on file.  No orders of the defined types were placed in this encounter.  No orders of the defined types were placed in this encounter.   Signed,  Maud Deed. Tamber Burtch, MD

## 2018-12-04 ENCOUNTER — Encounter: Payer: Self-pay | Admitting: Family Medicine

## 2018-12-04 ENCOUNTER — Ambulatory Visit (INDEPENDENT_AMBULATORY_CARE_PROVIDER_SITE_OTHER): Payer: 59 | Admitting: Family Medicine

## 2018-12-04 VITALS — BP 133/88 | HR 63 | Temp 97.9°F | Ht 69.0 in | Wt 195.0 lb

## 2018-12-04 DIAGNOSIS — E119 Type 2 diabetes mellitus without complications: Secondary | ICD-10-CM

## 2019-03-23 ENCOUNTER — Emergency Department (HOSPITAL_COMMUNITY)
Admission: EM | Admit: 2019-03-23 | Discharge: 2019-03-24 | Disposition: A | Discharge status: Home / Self Care | Payer: 59 | Attending: Emergency Medicine | Admitting: Emergency Medicine

## 2019-03-23 ENCOUNTER — Other Ambulatory Visit: Payer: Self-pay

## 2019-03-23 ENCOUNTER — Encounter (HOSPITAL_COMMUNITY): Payer: Self-pay

## 2019-03-23 DIAGNOSIS — Z87891 Personal history of nicotine dependence: Secondary | ICD-10-CM | POA: Insufficient documentation

## 2019-03-23 DIAGNOSIS — I10 Essential (primary) hypertension: Secondary | ICD-10-CM | POA: Insufficient documentation

## 2019-03-23 DIAGNOSIS — E119 Type 2 diabetes mellitus without complications: Secondary | ICD-10-CM | POA: Diagnosis not present

## 2019-03-23 DIAGNOSIS — H547 Unspecified visual loss: Secondary | ICD-10-CM | POA: Diagnosis present

## 2019-03-23 DIAGNOSIS — Z79899 Other long term (current) drug therapy: Secondary | ICD-10-CM | POA: Insufficient documentation

## 2019-03-23 NOTE — Discharge Instructions (Addendum)
You have any new eye pain, arm or leg weakness, difficulty speaking, or severe headache in  the next day please return to the ER

## 2019-03-23 NOTE — ED Notes (Signed)
Eugene Garnet, RN made aware of pt's documented blood pressure.

## 2019-03-23 NOTE — ED Triage Notes (Signed)
Pt states at approx 2000 L eye has black spots. Pt says he sees red spots now in L eye. Pt denies pain to L eye.

## 2019-03-23 NOTE — ED Notes (Signed)
Right Eye 20/50 Left Eye 20/70

## 2019-03-23 NOTE — ED Provider Notes (Signed)
[facility] DEPT Provider Note   CSN: [id number] Arrival date & time: 03/23/19  2053     History Chief Complaint  Patient presents with  . Vision Difficulty    Willie Johnson is a 59 y.o. male.  The history is provided by the patient.  Eye Problem Location:  Left eye Onset quality:  Sudden Duration:  3 hours Timing:  Constant Progression:  Improving Chronicity:  New Relieved by:  None tried Worsened by:  Nothing Associated symptoms: blurred vision   Associated symptoms: no double vision, no headaches, no numbness, no photophobia, no redness, no vomiting and no weakness   Patient reports sudden onset of left eye problem.  He reports that approximately 8:30 PM he started noticing black spots in his vision and started having flashes of red.  He reports continued spots in his vision.  No eye pain No eye trauma.  He reports LASIK surgery 20 years ago He does not wear contact lenses. No headache or weakness. No chest pain or shortness of breath.  No focal weakness      Past Medical History:  Diagnosis Date  . Allergic rhinitis due to pollen   . Allergy   . Diverticulosis   . GERD (gastroesophageal reflux disease)   . HTN (hypertension)   . Hyperlipidemia   . Type 2 diabetes mellitus without complication, without long-term current use of insulin ([facility]) 04/16/2018    Patient Active Problem List   Diagnosis Date Noted  . Type 2 diabetes mellitus without complication, without long-term current use of insulin ([facility]) 04/16/2018  . Essential hypertension 11/30/2016  . Hyperlipidemia     Past Surgical History:  Procedure Laterality Date  . COLONOSCOPY    . FRACTURE SURGERY     hand- scafoid muscle   . KNEE SURGERY         Family History  Problem Relation Age of Onset  . Arthritis Mother   . Hyperlipidemia Mother   . Heart disease Mother        Valve problem and CHF  . Hypertension Mother   . Diabetes Mother   . Hyperlipidemia  Father   . Heart disease Father   . Stroke Father        CVA   . Pulmonary embolism Sister   . Colon cancer Neg Hx   . Pancreatic cancer Neg Hx   . Stomach cancer Neg Hx   . Colon polyps Neg Hx   . Rectal cancer Neg Hx     Social History   Tobacco Use  . Smoking status: Former Smoker    Packs/day: 0.50    Years: 20.00    Pack years: 10.00    Quit date: 07/01/2017    Years since quitting: 1.7  . Smokeless tobacco: Never Used  . Tobacco comment: no vapes   Substance Use Topics  . Alcohol use: No    Alcohol/week: 0.0 standard drinks  . Drug use: No    Home Medications Prior to Admission medications   Medication Sig Start Date End Date Taking? Authorizing Provider  atorvastatin (LIPITOR) 40 MG tablet TAKE 1 TABLET BY MOUTH DAILY AT 6 PM. 11/07/18   Copland, Frederico Hamman, MD  losartan (COZAAR) 50 MG tablet Take 1 tablet (50 mg total) by mouth daily. 05/24/18   Copland, Frederico Hamman, MD  Multiple Vitamin (MULTIVITAMIN) tablet Take 1 tablet by mouth daily.    [provider]  pantoprazole (PROTONIX) 40 MG tablet TAKE 1 TABLET BY MOUTH EVERY DAY 09/15/18  Pyrtle, Lajuan Lines, MD  tamsulosin (FLOMAX) 0.4 MG CAPS capsule Take 1 capsule (0.4 mg total) by mouth daily. 06/21/18   Hayden Rasmussen, MD    Allergies    Patient has no known allergies.  Review of Systems   Review of Systems  Constitutional: Negative for fever.  Eyes: Positive for blurred vision and visual disturbance. Negative for double vision, photophobia, pain and redness.  Cardiovascular: Negative for chest pain.  Gastrointestinal: Negative for vomiting.  Neurological: Negative for weakness, numbness and headaches.  All other systems reviewed and are negative.   Physical Exam Updated Vital Signs BP (!) 167/83 (BP Location: Right Arm)   Pulse 93   Temp 98.1 F (36.7 C) (Oral)   Resp 20   Ht 1.753 m (5\' 9" )   Wt 97.5 kg   SpO2 100%   BMI 31.75 kg/m   Physical Exam CONSTITUTIONAL: Well developed/well  nourished HEAD: Normocephalic/atraumatic EYES: EOMI/PERRL, no nystagmus,  no ptosis, no conjunctival erythema, no foreign bodies, visual acuity OS  20/70 Funduscopic exam limited, no visual field deficit, no consensual pain, no corneal hazing ENMT: Mucous membranes moist NECK: supple no meningeal signs, no bruits CV: S1/S2 noted, no murmurs/rubs/gallops noted LUNGS: Lungs are clear to auscultation bilaterally, no apparent distress ABDOMEN: soft, nontender, no rebound or guarding GU:no cva tenderness NEURO:Awake/alert, face symmetric, no arm or leg drift is noted Equal 5/5 strength with shoulder abduction, elbow flex/extension, wrist flex/extension in upper extremities and equal hand grips bilaterally Equal 5/5 strength with hip flexion,knee extension, foot dorsi/plantar flexion Cranial nerves 3/4/5/6/09/13/08/11/12 tested and intact Gait normal without ataxia No past pointing Sensation to light touch intact in all extremities EXTREMITIES: pulses normal, full ROM SKIN: warm, color normal PSYCH: no abnormalities of mood noted  ED Results / Procedures / Treatments   Labs (all labs ordered are listed, but only abnormal results are displayed) Labs Reviewed - No data to display  EKG None  Radiology No results found.  Procedures Procedures   Medications Ordered in ED Medications - No data to display  ED Course  I have reviewed the triage vital signs and the nursing notes.      MDM Rules/Calculators/A&P                      Suspect vitreous hemorrhage or retinal detachment.  Low suspicion for stroke given history and exam Discussed with Dr. Manuella Ghazi with ophthalmology. He agrees the patient likely has a hemorrhage or detachment. Will see patient at 9 AM in his clinic.  Patient will be kept n.p.o. This is discussed with patient he is agreeable with plan We discussed strict ER return precautions. Low suspicion for acute neurologic emergency at this time. Final Clinical  Impression(s) / ED Diagnoses Final diagnoses:  Visual loss    Rx / DC Orders ED Discharge Orders    None       Ripley Fraise, MD 03/24/19 [phone]

## 2019-03-26 ENCOUNTER — Other Ambulatory Visit: Payer: 59

## 2019-03-26 ENCOUNTER — Encounter: Payer: Self-pay | Admitting: Family Medicine

## 2019-03-26 ENCOUNTER — Ambulatory Visit (INDEPENDENT_AMBULATORY_CARE_PROVIDER_SITE_OTHER): Payer: 59 | Admitting: Family Medicine

## 2019-03-26 VITALS — BP 129/79 | HR 86 | Temp 98.9°F | Ht 69.0 in | Wt 207.0 lb

## 2019-03-26 DIAGNOSIS — N410 Acute prostatitis: Secondary | ICD-10-CM | POA: Diagnosis not present

## 2019-03-26 DIAGNOSIS — R300 Dysuria: Secondary | ICD-10-CM

## 2019-03-26 LAB — POC URINALSYSI DIPSTICK (AUTOMATED)
Bilirubin, UA: NEGATIVE
Blood, UA: NEGATIVE
Glucose, UA: NEGATIVE
Ketones, UA: NEGATIVE
Leukocytes, UA: NEGATIVE
Nitrite, UA: NEGATIVE
Protein, UA: NEGATIVE
Spec Grav, UA: 1.01 (ref 1.010–1.025)
Urobilinogen, UA: 0.2 E.U./dL
pH, UA: 6.5 (ref 5.0–8.0)

## 2019-03-26 MED ORDER — CIPROFLOXACIN HCL 500 MG PO TABS: 500.0000 mg | ORAL_TABLET | Freq: Two times a day (BID) | ORAL | 0 refills | Status: AC

## 2019-03-26 NOTE — Progress Notes (Signed)
Willie Johnson T. [name], MD Primary Care and [place] at [facility] [place], [address] Phone: [phone]  FAX: [place] - 60 y.o. male  MRN [id number]  Date of Birth: 08-Dec-1960  Visit Date: 03/26/2019  PCP: Owens Loffler, MD  Referred by: Owens Loffler, MD Chief Complaint  Patient presents with  . Groin Pain  . Dysuria  . Fever   Virtual Visit via Video Note:  I connected with  Willie Johnson on 03/26/2019 11:00 AM EST by a video enabled telemedicine application and verified that I am speaking with the correct person using two identifiers.   Location patient: home computer, tablet, or smartphone Location provider: work or home office Consent: Verbal consent directly obtained from [employer]. Persons participating in the virtual visit: patient, provider  I discussed the limitations of evaluation and management by telemedicine and the availability of in person appointments. The patient expressed understanding and agreed to proceed.  Interactive audio and video telecommunications were attempted between this provider and patient, however failed, due to patient having technical difficulties OR patient did not have access to video capability.  We continued and completed visit with audio only.    History of Present Illness:  He tells me that he has been having some pain in the perineal area posterior to the shaft of his penis.  There is a dull ache in this region.  Does have some discomfort when he urinates.  He has been in a monogamous relationship for about 25 years.  No concern for STDs.  Got a fever to 100.5. ? Loss of vision.  3 hours in the ER.  Per report he has some blood in his vitreous.  He does have follow-up with ophthalmology.  Review of Systems as above: See pertinent positives and pertinent negatives per HPI No acute distress verbally  Past Medical History, Surgical  History, Social History, Family History, Problem List, Medications, and Allergies have been reviewed and updated if relevant.   Observations/Objective/Exam:  An attempt was made to discern vital signs over the phone and per patient if applicable and possible.   General:    Alert, Oriented, appears well and in no acute distress HEENT:     Atraumatic, conjunctiva clear, no obvious abnormalities on inspection of external nose and ears.  Neck:    Normal movements of the head and neck Pulmonary:     On inspection no signs of respiratory distress, breathing rate appears normal, no obvious gross SOB, gasping or wheezing Cardiovascular:    No obvious cyanosis Musculoskeletal:    Moves all visible extremities without noticeable abnormality Psych / Neurological:     Pleasant and cooperative, no obvious depression or anxiety, speech and thought processing grossly intact  Assessment and Plan:    ICD-10-CM   1. Acute prostatitis without hematuria  N41.0 ciprofloxacin (CIPRO) 500 MG tablet  2. Dysuria  R30.0 POCT Urinalysis Dipstick (Automated)    Urine culture   Total encounter time: 20-29 minutes. On the day of the patient encounter, this can include review of prior records, labs, and imaging.  Additional time can include counselling, consultation with peer MD in person or by telephone.  This also includes independent review of Radiology.  Pretty classic history for prostatitis, and I will treat as such.  We do have a culture in place  I discussed the assessment and treatment plan with the patient. The patient was provided an opportunity to  ask questions and all were answered. The patient agreed with the plan and demonstrated an understanding of the instructions.   The patient was advised to call back or seek an in-person evaluation if the symptoms worsen or if the condition fails to improve as anticipated.  Follow-up: prn unless noted otherwise below No follow-ups on file.  Meds ordered  this encounter  Medications  . ciprofloxacin (CIPRO) 500 MG tablet    Sig: Take 1 tablet (500 mg total) by mouth 2 (two) times daily.    Dispense:  60 tablet    Refill:  0   Orders Placed This Encounter  Procedures  . Urine culture  . POCT Urinalysis Dipstick (Automated)    Signed,  Kaevon Cotta T. Shaday Rayborn, MD

## 2019-03-28 LAB — URINE CULTURE
MICRO NUMBER:: 10052495
Result:: NO GROWTH
SPECIMEN QUALITY:: ADEQUATE

## 2019-04-05 ENCOUNTER — Encounter: Payer: Self-pay | Admitting: Family Medicine

## 2019-04-06 MED ORDER — LOSARTAN POTASSIUM 100 MG PO TABS: 100.0000 mg | ORAL_TABLET | Freq: Every day | ORAL | 3 refills | Status: DC

## 2019-04-06 MED ORDER — LORAZEPAM 0.5 MG PO TABS: 0.5000 mg | ORAL_TABLET | Freq: Three times a day (TID) | ORAL | 0 refills | Status: DC | PRN

## 2019-05-03 ENCOUNTER — Other Ambulatory Visit: Payer: Self-pay | Admitting: Family Medicine

## 2019-05-03 DIAGNOSIS — Z0000 Encounter for general adult medical examination without abnormal findings: Secondary | ICD-10-CM

## 2019-05-03 DIAGNOSIS — Z131 Encounter for screening for diabetes mellitus: Secondary | ICD-10-CM

## 2019-05-04 ENCOUNTER — Other Ambulatory Visit: Payer: Self-pay

## 2019-05-04 ENCOUNTER — Other Ambulatory Visit (INDEPENDENT_AMBULATORY_CARE_PROVIDER_SITE_OTHER): Payer: 59

## 2019-05-04 DIAGNOSIS — Z0000 Encounter for general adult medical examination without abnormal findings: Secondary | ICD-10-CM | POA: Diagnosis not present

## 2019-05-04 DIAGNOSIS — Z131 Encounter for screening for diabetes mellitus: Secondary | ICD-10-CM

## 2019-05-04 LAB — BASIC METABOLIC PANEL
BUN: 11 mg/dL (ref 6–23)
CO2: 29 mEq/L (ref 19–32)
Calcium: 9.4 mg/dL (ref 8.4–10.5)
Chloride: 106 mEq/L (ref 96–112)
Creatinine, Ser: 0.89 mg/dL (ref 0.40–1.50)
GFR: 88.07 mL/min (ref >60.00)
Glucose, Bld: 93 mg/dL (ref 70–99)
Potassium: 4.3 mEq/L (ref 3.5–5.1)
Sodium: 140 mEq/L (ref 135–145)

## 2019-05-04 LAB — CBC WITH DIFFERENTIAL/PLATELET
Basophils Absolute: 0 10*3/uL (ref 0.0–0.1)
Basophils Relative: 0.5 % (ref 0.0–3.0)
Eosinophils Absolute: 0.2 10*3/uL (ref 0.0–0.7)
Eosinophils Relative: 2.7 % (ref 0.0–5.0)
HCT: 42.5 % (ref 39.0–52.0)
Hemoglobin: 14.6 g/dL (ref 13.0–17.0)
Lymphocytes Relative: 25.1 % (ref 12.0–46.0)
Lymphs Abs: 2.1 10*3/uL (ref 0.7–4.0)
MCHC: 34.4 g/dL (ref 30.0–36.0)
MCV: 87.9 fl (ref 78.0–100.0)
Monocytes Absolute: 0.6 10*3/uL (ref 0.1–1.0)
Monocytes Relative: 7.4 % (ref 3.0–12.0)
Neutro Abs: 5.3 10*3/uL (ref 1.4–7.7)
Neutrophils Relative %: 64.3 % (ref 43.0–77.0)
Platelets: 269 10*3/uL (ref 150.0–400.0)
RBC: 4.83 Mil/uL (ref 4.22–5.81)
RDW: 14.1 % (ref 11.5–15.5)
WBC: 8.3 10*3/uL (ref 4.0–10.5)

## 2019-05-04 LAB — HEPATIC FUNCTION PANEL
ALT: 32 U/L (ref 0–53)
AST: 17 U/L (ref 0–37)
Albumin: 4.1 g/dL (ref 3.5–5.2)
Alkaline Phosphatase: 86 U/L (ref 39–117)
Bilirubin, Direct: 0.1 mg/dL (ref 0.0–0.3)
Total Bilirubin: 0.6 mg/dL (ref 0.2–1.2)
Total Protein: 6.8 g/dL (ref 6.0–8.3)

## 2019-05-04 LAB — LIPID PANEL
Cholesterol: 127 mg/dL (ref 0–200)
HDL: 29.5 mg/dL — ABNORMAL LOW (ref >39.00)
LDL Cholesterol: 65 mg/dL (ref 0–99)
NonHDL: 97.42
Total CHOL/HDL Ratio: 4
Triglycerides: 163 mg/dL — ABNORMAL HIGH (ref 0.0–149.0)
VLDL: 32.6 mg/dL (ref 0.0–40.0)

## 2019-05-04 NOTE — Addendum Note (Signed)
Addended by: Ellamae Sia on: 05/04/2019 08:23 AM   Modules accepted: Orders

## 2019-05-06 ENCOUNTER — Other Ambulatory Visit: Payer: Self-pay | Admitting: Family Medicine

## 2019-05-07 LAB — HEMOGLOBIN A1C
Hgb A1c MFr Bld: 5.9 % of total Hgb — ABNORMAL HIGH (ref <5.7)
Mean Plasma Glucose: 123 (calc)
eAG (mmol/L): 6.8 (calc)

## 2019-05-07 LAB — PSA, TOTAL WITH REFLEX TO PSA, FREE: PSA, Total: 0.5 ng/mL (ref <=4.0)

## 2019-05-09 ENCOUNTER — Encounter: Payer: 59 | Admitting: Family Medicine

## 2019-05-14 ENCOUNTER — Other Ambulatory Visit: Payer: Self-pay | Admitting: Family Medicine

## 2019-05-14 DIAGNOSIS — E7800 Pure hypercholesterolemia, unspecified: Secondary | ICD-10-CM

## 2019-07-09 ENCOUNTER — Encounter: Payer: Self-pay | Admitting: Family Medicine

## 2019-07-09 ENCOUNTER — Ambulatory Visit (INDEPENDENT_AMBULATORY_CARE_PROVIDER_SITE_OTHER): Payer: 59 | Admitting: Family Medicine

## 2019-07-09 ENCOUNTER — Other Ambulatory Visit: Payer: Self-pay

## 2019-07-09 VITALS — BP 130/90 | HR 64 | Temp 98.3°F | Ht 68.5 in | Wt 213.8 lb

## 2019-07-09 DIAGNOSIS — Z0000 Encounter for general adult medical examination without abnormal findings: Secondary | ICD-10-CM

## 2019-07-09 DIAGNOSIS — G5712 Meralgia paresthetica, left lower limb: Secondary | ICD-10-CM | POA: Diagnosis not present

## 2019-07-09 DIAGNOSIS — E119 Type 2 diabetes mellitus without complications: Secondary | ICD-10-CM

## 2019-07-09 NOTE — Progress Notes (Signed)
Bray[facility] T. Mia Milan, MD, [place] at [facility] [place], [address]  Phone: [phone]  FAX: [place] - 59 y.o. male  MRN [id number]  Date of Birth: 01-02-1961  Date: 07/09/2019  PCP: Owens Loffler, MD  Referral: Owens Loffler, MD  Chief Complaint  Patient presents with  . Annual Exam    This visit occurred during the SARS-CoV-2 public health emergency.  Safety protocols were in place, including screening questions prior to the visit, additional usage of staff PPE, and extensive cleaning of exam room while observing appropriate contact time as indicated for disinfecting solutions.   Patient Care Team: Owens Loffler, MD as PCP - General (Family Medicine) Subjective:   Willie Johnson is a 59 y.o. pleasant patient who presents with the following:  Preventative Health Maintenance Visit:  Health Maintenance Summary Reviewed and updated, unless pt declines services.  Tobacco History Reviewed. Alcohol: No concerns, no excessive use Exercise Habits: Some activity, rec at least 30 mins 5 times a week STD concerns: no risk or activity to increase risk Drug Use: None  Hm utd  Anterolateral: L lateral leg numbnress.  Pressure and numness. Standing - will go down to th left leg.  Then will go numb. Mostly in thigh, occ lower  Hemorrhage - retina specialist - vitreous hemorrhage.   Needs shingrix 2  Wt Readings from Last 3 Encounters:  07/09/19 213 lb 12 oz (97 kg)  03/26/19 207 lb (93.9 kg)  03/23/19 215 lb (97.5 kg)    Diabetes Mellitus: Tolerating Medications: yes Compliance with diet: fair, Body mass index is 32.03 kg/m. Exercise: minimal / intermittent Avg blood sugars at home: not checking Foot problems: none Hypoglycemia: none No nausea, vomitting, blurred vision, polyuria.  Lab Results  Component Value Date   HGBA1C 5.9 (H)  05/04/2019   HGBA1C 6.8 (H) 04/11/2018   Lab Results  Component Value Date   LDLCALC 65 05/04/2019   CREATININE 0.89 05/04/2019    Wt Readings from Last 3 Encounters:  07/09/19 213 lb 12 oz (97 kg)  03/26/19 207 lb (93.9 kg)  03/23/19 215 lb (97.5 kg)    BP normalized with increased ARB  Health Maintenance  Topic Date Due  . FOOT EXAM  Never done  . OPHTHALMOLOGY EXAM  Never done  . INFLUENZA VACCINE  10/07/2019  . HEMOGLOBIN A1C  11/01/2019  . COLONOSCOPY  05/08/2023  . TETANUS/TDAP  12/13/2023  . PNEUMOCOCCAL POLYSACCHARIDE VACCINE AGE 6-64 HIGH RISK  Completed  . COVID-19 Vaccine  Completed  . Hepatitis C Screening  Completed  . HIV Screening  Completed   Immunization History  Administered Date(s) Administered  . Influenza,inj,Quad PF,6+ Mos 11/27/2014, 01/19/2016, 01/07/2017, 12/09/2017, 12/01/2018  . Influenza-Unspecified 11/29/2013  . PFIZER SARS-COV-2 Vaccination 05/16/2019, 06/06/2019  . Pneumococcal Polysaccharide-23 04/19/2018  . Tdap 12/12/2013  . Zoster Recombinat (Shingrix) 04/01/2018   Patient Active Problem List   Diagnosis Date Noted  . Type 2 diabetes mellitus without complication, without long-term current use of insulin ([facility]) 04/16/2018  . Essential hypertension 11/30/2016  . Hyperlipidemia     Past Medical History:  Diagnosis Date  . Allergic rhinitis due to pollen   . Allergy   . Diverticulosis   . GERD (gastroesophageal reflux disease)   . HTN (hypertension)   . Hyperlipidemia   . Type 2 diabetes mellitus without complication, without long-term current use of insulin ([facility]) 04/16/2018  Past Surgical History:  Procedure Laterality Date  . COLONOSCOPY    . FRACTURE SURGERY     hand- scafoid muscle   . KNEE SURGERY      Family History  Problem Relation Age of Onset  . Arthritis Mother   . Hyperlipidemia Mother   . Heart disease Mother        Valve problem and CHF  . Hypertension Mother   . Diabetes Mother   . Hyperlipidemia  Father   . Heart disease Father   . Stroke Father        CVA   . Pulmonary embolism Sister   . Colon cancer Neg Hx   . Pancreatic cancer Neg Hx   . Stomach cancer Neg Hx   . Colon polyps Neg Hx   . Rectal cancer Neg Hx     Past Medical History, Surgical History, Social History, Family History, Problem List, Medications, and Allergies have been reviewed and updated if relevant.  Review of Systems: Pertinent positives are listed above.  Otherwise, a full 14 point review of systems has been done in full and it is negative except where it is noted positive.  Objective:   BP 130/90   Pulse 64   Temp 98.3 F (36.8 C) (Temporal)   Ht 5' 8.5" (1.74 m)   Wt 213 lb 12 oz (97 kg)   SpO2 94%   BMI 32.03 kg/m  Ideal Body Weight: Weight in (lb) to have BMI = 25: 166.5  Ideal Body Weight: Weight in (lb) to have BMI = 25: 166.5 No exam data present Depression screen [facility] 2/9 07/09/2019 04/19/2018 02/10/2017  Decreased Interest 0 0 0  Down, Depressed, Hopeless 0 0 0  PHQ - 2 Score 0 0 0     GEN: well developed, well nourished, no acute distress Eyes: conjunctiva and lids normal, PERRLA, EOMI ENT: TM clear, nares clear, oral exam WNL Neck: supple, no lymphadenopathy, no thyromegaly, no JVD Pulm: clear to auscultation and percussion, respiratory effort normal CV: regular rate and rhythm, S1-S2, no murmur, rub or gallop, no bruits, peripheral pulses normal and symmetric, no cyanosis, clubbing, edema or varicosities GI: soft, non-tender; no hepatosplenomegaly, masses; active bowel sounds all quadrants GU: no hernia, testicular mass, penile discharge Lymph: no cervical, axillary or inguinal adenopathy  HIP EXAM: SIDE: l ROM: Abduction, Flexion, Internal and External range of motion: full Pain with terminal IROM and EROM: no GTB: NT SLR: NEG Knees: No effusion FABER: NT REVERSE FABER: NT, neg Piriformis: NT at direct palpation Str: flexion: 5/5 abduction: 5/5 adduction: 5/5 Strength  testing non-tender      SKIN: clear, good turgor, color WNL, no rashes, lesions, or ulcerations Neuro: normal mental status, normal strength, sensation, and motion Psych: alert; oriented to person, place and time, normally interactive and not anxious or depressed in appearance.  All labs reviewed with patient. Results for orders placed or performed in visit on 05/04/19  Hemoglobin A1c  Result Value Ref Range   Hgb A1c MFr Bld 5.9 (H) <5.7 % of total Hgb   Mean Plasma Glucose 123 (calc)   eAG (mmol/L) 6.8 (calc)  PSA, Total with Reflex to PSA, Free  Result Value Ref Range   PSA, Total 0.5 < OR = 4.0 ng/mL  Basic metabolic panel  Result Value Ref Range   Sodium 140 135 - 145 mEq/L   Potassium 4.3 3.5 - 5.1 mEq/L   Chloride 106 96 - 112 mEq/L   CO2 29 19 - 32  mEq/L   Glucose, Bld 93 70 - 99 mg/dL   BUN 11 6 - 23 mg/dL   Creatinine, Ser 0.89 0.40 - 1.50 mg/dL   GFR 88.07 >60.00 mL/min   Calcium 9.4 8.4 - 10.5 mg/dL  Hepatic function panel  Result Value Ref Range   Total Bilirubin 0.6 0.2 - 1.2 mg/dL   Bilirubin, Direct 0.1 0.0 - 0.3 mg/dL   Alkaline Phosphatase 86 39 - 117 U/L   AST 17 0 - 37 U/L   ALT 32 0 - 53 U/L   Total Protein 6.8 6.0 - 8.3 g/dL   Albumin 4.1 3.5 - 5.2 g/dL  CBC with Differential/Platelet  Result Value Ref Range   WBC 8.3 4.0 - 10.5 K/uL   RBC 4.83 4.22 - 5.81 Mil/uL   Hemoglobin 14.6 13.0 - 17.0 g/dL   HCT 42.5 39.0 - 52.0 %   MCV 87.9 78.0 - 100.0 fl   MCHC 34.4 30.0 - 36.0 g/dL   RDW 14.1 11.5 - 15.5 %   Platelets 269.0 150.0 - 400.0 K/uL   Neutrophils Relative % 64.3 43.0 - 77.0 %   Lymphocytes Relative 25.1 12.0 - 46.0 %   Monocytes Relative 7.4 3.0 - 12.0 %   Eosinophils Relative 2.7 0.0 - 5.0 %   Basophils Relative 0.5 0.0 - 3.0 %   Neutro Abs 5.3 1.4 - 7.7 K/uL   Lymphs Abs 2.1 0.7 - 4.0 K/uL   Monocytes Absolute 0.6 0.1 - 1.0 K/uL   Eosinophils Absolute 0.2 0.0 - 0.7 K/uL   Basophils Absolute 0.0 0.0 - 0.1 K/uL  Lipid panel  Result  Value Ref Range   Cholesterol 127 0 - 200 mg/dL   Triglycerides 163.0 (H) 0.0 - 149.0 mg/dL   HDL 29.50 (L) >39.00 mg/dL   VLDL 32.6 0.0 - 40.0 mg/dL   LDL Cholesterol 65 0 - 99 mg/dL   Total CHOL/HDL Ratio 4    NonHDL 97.42     Assessment and Plan:     ICD-10-CM   1. Healthcare maintenance  Z00.00   2. Type 2 diabetes mellitus without complication, without long-term current use of insulin (HCC)  E11.9   3. Meralgia paraesthetica, left  G57.12    HM and DM utd and doing well  MP: classic and should respond to rehab, stretches flexors and psoas, posterior hips Patient Instructions  [email or web address]    Health Maintenance Exam: The patient's preventative maintenance and recommended screening tests for an annual wellness exam were reviewed in full today. Brought up to date unless services declined.  Counselled on the importance of diet, exercise, and its role in overall health and mortality. The patient's FH and SH was reviewed, including their home life, tobacco status, and drug and alcohol status.  Follow-up in 1 year for physical exam or additional follow-up below.  Follow-up: No follow-ups on file. Or follow-up in 1 year if not noted.  No orders of the defined types were placed in this encounter.  There are no discontinued medications. No orders of the defined types were placed in this encounter.   Signed,  Maud Deed. Rosalie Gelpi, MD   Allergies as of 07/09/2019   No Known Allergies     Medication List       Accurate as of Jul 09, 2019  8:50 AM. If you have any questions, ask your nurse or doctor.        atorvastatin 40 MG tablet Commonly known as: LIPITOR TAKE 1 TABLET DAILY AT 6 PM  LORazepam 0.5 MG tablet Commonly known as: ATIVAN Take 1 tablet (0.5 mg total) by mouth every 8 (eight) hours as needed for anxiety.   losartan 100 MG tablet Commonly known as: COZAAR Take 1 tablet (100 mg total) by mouth  daily.   multivitamin tablet Take 1 tablet by mouth daily.   pantoprazole 40 MG tablet Commonly known as: PROTONIX TAKE 1 TABLET BY MOUTH EVERY DAY   tamsulosin 0.4 MG Caps capsule Commonly known as: FLOMAX Take 1 capsule (0.4 mg total) by mouth daily.

## 2019-07-09 NOTE — Patient Instructions (Signed)
[email or web address]

## 2020-05-06 ENCOUNTER — Other Ambulatory Visit: Payer: Self-pay | Admitting: Family Medicine

## 2020-05-06 DIAGNOSIS — E7800 Pure hypercholesterolemia, unspecified: Secondary | ICD-10-CM

## 2020-06-18 ENCOUNTER — Ambulatory Visit (INDEPENDENT_AMBULATORY_CARE_PROVIDER_SITE_OTHER): Payer: 59 | Admitting: Psychology

## 2020-06-18 DIAGNOSIS — F411 Generalized anxiety disorder: Secondary | ICD-10-CM | POA: Diagnosis not present

## 2020-06-20 ENCOUNTER — Other Ambulatory Visit: Payer: Self-pay | Admitting: Family Medicine

## 2020-07-02 ENCOUNTER — Ambulatory Visit (INDEPENDENT_AMBULATORY_CARE_PROVIDER_SITE_OTHER): Payer: 59 | Admitting: Psychology

## 2020-07-02 DIAGNOSIS — F411 Generalized anxiety disorder: Secondary | ICD-10-CM | POA: Diagnosis not present

## 2020-07-04 ENCOUNTER — Other Ambulatory Visit: Payer: Self-pay | Admitting: Family Medicine

## 2020-07-04 DIAGNOSIS — E119 Type 2 diabetes mellitus without complications: Secondary | ICD-10-CM

## 2020-07-04 DIAGNOSIS — Z79899 Other long term (current) drug therapy: Secondary | ICD-10-CM

## 2020-07-04 DIAGNOSIS — E7800 Pure hypercholesterolemia, unspecified: Secondary | ICD-10-CM

## 2020-07-04 DIAGNOSIS — Z125 Encounter for screening for malignant neoplasm of prostate: Secondary | ICD-10-CM

## 2020-07-07 ENCOUNTER — Other Ambulatory Visit: Payer: Self-pay

## 2020-07-07 ENCOUNTER — Other Ambulatory Visit (INDEPENDENT_AMBULATORY_CARE_PROVIDER_SITE_OTHER): Payer: 59

## 2020-07-07 DIAGNOSIS — Z79899 Other long term (current) drug therapy: Secondary | ICD-10-CM | POA: Diagnosis not present

## 2020-07-07 DIAGNOSIS — E119 Type 2 diabetes mellitus without complications: Secondary | ICD-10-CM | POA: Diagnosis not present

## 2020-07-07 DIAGNOSIS — Z125 Encounter for screening for malignant neoplasm of prostate: Secondary | ICD-10-CM

## 2020-07-07 DIAGNOSIS — E7800 Pure hypercholesterolemia, unspecified: Secondary | ICD-10-CM

## 2020-07-07 LAB — MICROALBUMIN / CREATININE URINE RATIO
Creatinine,U: 73.7 mg/dL
Microalb Creat Ratio: 0.9 mg/g (ref 0.0–30.0)
Microalb, Ur: <0.7 mg/dL (ref 0.0–1.9)

## 2020-07-07 LAB — CBC WITH DIFFERENTIAL/PLATELET
Basophils Absolute: 0 10*3/uL (ref 0.0–0.1)
Basophils Relative: 0.7 % (ref 0.0–3.0)
Eosinophils Absolute: 0.2 10*3/uL (ref 0.0–0.7)
Eosinophils Relative: 2.4 % (ref 0.0–5.0)
HCT: 41.3 % (ref 39.0–52.0)
Hemoglobin: 14.2 g/dL (ref 13.0–17.0)
Lymphocytes Relative: 30 % (ref 12.0–46.0)
Lymphs Abs: 2.1 10*3/uL (ref 0.7–4.0)
MCHC: 34.4 g/dL (ref 30.0–36.0)
MCV: 85.4 fl (ref 78.0–100.0)
Monocytes Absolute: 0.5 10*3/uL (ref 0.1–1.0)
Monocytes Relative: 7 % (ref 3.0–12.0)
Neutro Abs: 4.2 10*3/uL (ref 1.4–7.7)
Neutrophils Relative %: 59.9 % (ref 43.0–77.0)
Platelets: 256 10*3/uL (ref 150.0–400.0)
RBC: 4.83 Mil/uL (ref 4.22–5.81)
RDW: 14.3 % (ref 11.5–15.5)
WBC: 7.1 10*3/uL (ref 4.0–10.5)

## 2020-07-07 LAB — HEPATIC FUNCTION PANEL
ALT: 23 U/L (ref 0–53)
AST: 19 U/L (ref 0–37)
Albumin: 4.3 g/dL (ref 3.5–5.2)
Alkaline Phosphatase: 86 U/L (ref 39–117)
Bilirubin, Direct: 0.1 mg/dL (ref 0.0–0.3)
Total Bilirubin: 0.7 mg/dL (ref 0.2–1.2)
Total Protein: 6.8 g/dL (ref 6.0–8.3)

## 2020-07-07 LAB — LIPID PANEL
Cholesterol: 158 mg/dL (ref 0–200)
HDL: 32.9 mg/dL — ABNORMAL LOW (ref >39.00)
LDL Cholesterol: 94 mg/dL (ref 0–99)
NonHDL: 125.05
Total CHOL/HDL Ratio: 5
Triglycerides: 157 mg/dL — ABNORMAL HIGH (ref 0.0–149.0)
VLDL: 31.4 mg/dL (ref 0.0–40.0)

## 2020-07-07 LAB — BASIC METABOLIC PANEL
BUN: 14 mg/dL (ref 6–23)
CO2: 28 mEq/L (ref 19–32)
Calcium: 9.3 mg/dL (ref 8.4–10.5)
Chloride: 104 mEq/L (ref 96–112)
Creatinine, Ser: 0.99 mg/dL (ref 0.40–1.50)
GFR: 84.1 mL/min (ref >60.00)
Glucose, Bld: 94 mg/dL (ref 70–99)
Potassium: 4 mEq/L (ref 3.5–5.1)
Sodium: 140 mEq/L (ref 135–145)

## 2020-07-07 LAB — HEMOGLOBIN A1C: Hgb A1c MFr Bld: 6 % (ref 4.6–6.5)

## 2020-07-08 LAB — PSA, TOTAL WITH REFLEX TO PSA, FREE: PSA, Total: 0.6 ng/mL (ref <=4.0)

## 2020-07-13 ENCOUNTER — Encounter: Payer: Self-pay | Admitting: Family Medicine

## 2020-07-13 NOTE — Progress Notes (Signed)
Indira Sorenson T. Brodi Kari, MD, [place] at [facility] [place], [address]  Phone: [phone]  FAX: [place] - 60 y.o. male  MRN [id number]  Date of Birth: 02/08/1961  Date: 07/14/2020  PCP: Owens Loffler, MD  Referral: Owens Loffler, MD  Chief Complaint  Patient presents with  . Annual Exam    This visit occurred during the SARS-CoV-2 public health emergency.  Safety protocols were in place, including screening questions prior to the visit, additional usage of staff PPE, and extensive cleaning of exam room while observing appropriate contact time as indicated for disinfecting solutions.   Patient Care Team: Owens Loffler, MD as PCP - General (Family Medicine) Subjective:   Willie Johnson is a 60 y.o. pleasant patient who presents with the following:  Preventative Health Maintenance Visit:  Health Maintenance Summary Reviewed and updated, unless pt declines services.  Tobacco History Reviewed. Alcohol: No concerns, no excessive use Exercise Habits: Some activity, rec at least 30 mins 5 times a week STD concerns: no risk or activity to increase risk Drug Use: None  Check COVID status?  While he did have an elevated A1c at 1 point, he has never taken any medication Compliance with diet: good  Lab Results  Component Value Date   HGBA1C 6.0 07/07/2020   HGBA1C 5.9 (H) 05/04/2019   HGBA1C 6.8 (H) 04/11/2018   Lab Results  Component Value Date   MICROALBUR <0.7 07/07/2020   LDLCALC 94 07/07/2020   CREATININE 0.99 07/07/2020    Has been having some anxiety.   Friends and family have noticed Irritable.  Sleeping only  No inciting event.   No real work covid stress Ongoing for several months Has been seeing Dr. Cheryln Manly Exercising better  Health Maintenance  Topic Date Due  . FOOT EXAM  Never done  . OPHTHALMOLOGY EXAM  Never done   . COVID-19 Vaccine (3 - Booster for Pfizer series) 12/06/2019  . INFLUENZA VACCINE  10/06/2020  . HEMOGLOBIN A1C  01/07/2021  . COLONOSCOPY (Pts 45-28yrs Insurance coverage will need to be confirmed)  05/08/2023  . TETANUS/TDAP  12/13/2023  . PNEUMOCOCCAL POLYSACCHARIDE VACCINE AGE 43-64 HIGH RISK  Completed  . Hepatitis C Screening  Completed  . HIV Screening  Completed  . HPV VACCINES  Aged Out   Immunization History  Administered Date(s) Administered  . Influenza,inj,Quad PF,6+ Mos 11/27/2014, 01/19/2016, 01/07/2017, 12/09/2017, 12/01/2018, 12/25/2019  . Influenza-Unspecified 11/29/2013  . PFIZER(Purple Top)SARS-COV-2 Vaccination 05/16/2019, 06/06/2019  . Pneumococcal Polysaccharide-23 04/19/2018  . Tdap 12/12/2013  . Zoster Recombinat (Shingrix) 04/01/2018   Patient Active Problem List   Diagnosis Date Noted  . Type 2 diabetes mellitus without complication, without long-term current use of insulin ([place]) 04/16/2018  . Essential hypertension 11/30/2016  . Hyperlipidemia     Past Medical History:  Diagnosis Date  . Allergic rhinitis due to pollen   . Diverticulosis   . GERD (gastroesophageal reflux disease)   . HTN (hypertension)   . Hyperlipidemia   . Type 2 diabetes mellitus without complication, without long-term current use of insulin ([place]) 04/16/2018    Past Surgical History:  Procedure Laterality Date  . KNEE SURGERY    . SCAPHOID FRACTURE SURGERY      Family History  Problem Relation Age of Onset  . Arthritis Mother   . Hyperlipidemia Mother   . Heart disease Mother  Valve problem and CHF  . Hypertension Mother   . Diabetes Mother   . Hyperlipidemia Father   . Heart disease Father   . Stroke Father        CVA   . Pulmonary embolism Sister   . Colon cancer Neg Hx   . Pancreatic cancer Neg Hx   . Stomach cancer Neg Hx   . Colon polyps Neg Hx   . Rectal cancer Neg Hx     Past Medical History, Surgical History, Social History, Family History,  Problem List, Medications, and Allergies have been reviewed and updated if relevant.  Review of Systems: Pertinent positives are listed above.  Otherwise, a full 14 point review of systems has been done in full and it is negative except where it is noted positive.  Objective:   BP 124/90   Pulse 60   Temp 98 F (36.7 C) (Temporal)   Ht 5\' 9"  (1.753 m)   Wt 210 lb 8 oz (95.5 kg)   SpO2 99%   BMI 31.09 kg/m  Ideal Body Weight: Weight in (lb) to have BMI = 25: 168.9  Ideal Body Weight: Weight in (lb) to have BMI = 25: 168.9 No exam data present Depression screen [facility] 2/9 07/14/2020 07/09/2019 04/19/2018 02/10/2017  Decreased Interest 0 0 0 0  Down, Depressed, Hopeless 0 0 0 0  PHQ - 2 Score 0 0 0 0     GEN: well developed, well nourished, no acute distress Eyes: conjunctiva and lids normal, PERRLA, EOMI ENT: TM clear, nares clear, oral exam WNL Neck: supple, no lymphadenopathy, no thyromegaly, no JVD Pulm: clear to auscultation and percussion, respiratory effort normal CV: regular rate and rhythm, S1-S2, no murmur, rub or gallop, no bruits, peripheral pulses normal and symmetric, no cyanosis, clubbing, edema or varicosities GI: soft, non-tender; no hepatosplenomegaly, masses; active bowel sounds all quadrants GU: deferred Lymph: no cervical, axillary or inguinal adenopathy MSK: gait normal, muscle tone and strength WNL, no joint swelling, effusions, discoloration, crepitus  SKIN: clear, good turgor, color WNL, no rashes, lesions, or ulcerations Neuro: normal mental status, normal strength, sensation, and motion Psych: alert; oriented to person, place and time, normally interactive and not anxious or depressed in appearance.  All labs reviewed with patient. Results for orders placed or performed in visit on 40/10/27  Basic metabolic panel  Result Value Ref Range   Sodium 140 135 - 145 mEq/L   Potassium 4.0 3.5 - 5.1 mEq/L   Chloride 104 96 - 112 mEq/L   CO2 28 19 - 32 mEq/L    Glucose, Bld 94 70 - 99 mg/dL   BUN 14 6 - 23 mg/dL   Creatinine, Ser 0.99 0.40 - 1.50 mg/dL   GFR 84.10 >60.00 mL/min   Calcium 9.3 8.4 - 10.5 mg/dL  PSA, Total with Reflex to PSA, Free  Result Value Ref Range   PSA, Total 0.6 < OR = 4.0 ng/mL  CBC with Differential/Platelet  Result Value Ref Range   WBC 7.1 4.0 - 10.5 K/uL   RBC 4.83 4.22 - 5.81 Mil/uL   Hemoglobin 14.2 13.0 - 17.0 g/dL   HCT 41.3 39.0 - 52.0 %   MCV 85.4 78.0 - 100.0 fl   MCHC 34.4 30.0 - 36.0 g/dL   RDW 14.3 11.5 - 15.5 %   Platelets 256.0 150.0 - 400.0 K/uL   Neutrophils Relative % 59.9 43.0 - 77.0 %   Lymphocytes Relative 30.0 12.0 - 46.0 %   Monocytes Relative 7.0  3.0 - 12.0 %   Eosinophils Relative 2.4 0.0 - 5.0 %   Basophils Relative 0.7 0.0 - 3.0 %   Neutro Abs 4.2 1.4 - 7.7 K/uL   Lymphs Abs 2.1 0.7 - 4.0 K/uL   Monocytes Absolute 0.5 0.1 - 1.0 K/uL   Eosinophils Absolute 0.2 0.0 - 0.7 K/uL   Basophils Absolute 0.0 0.0 - 0.1 K/uL  Microalbumin / creatinine urine ratio  Result Value Ref Range   Microalb, Ur <0.7 0.0 - 1.9 mg/dL   Creatinine,U 73.7 mg/dL   Microalb Creat Ratio 0.9 0.0 - 30.0 mg/g  Hemoglobin A1c  Result Value Ref Range   Hgb A1c MFr Bld 6.0 4.6 - 6.5 %  Hepatic function panel  Result Value Ref Range   Total Bilirubin 0.7 0.2 - 1.2 mg/dL   Bilirubin, Direct 0.1 0.0 - 0.3 mg/dL   Alkaline Phosphatase 86 39 - 117 U/L   AST 19 0 - 37 U/L   ALT 23 0 - 53 U/L   Total Protein 6.8 6.0 - 8.3 g/dL   Albumin 4.3 3.5 - 5.2 g/dL  Lipid panel  Result Value Ref Range   Cholesterol 158 0 - 200 mg/dL   Triglycerides 157.0 (H) 0.0 - 149.0 mg/dL   HDL 32.90 (L) >39.00 mg/dL   VLDL 31.4 0.0 - 40.0 mg/dL   LDL Cholesterol 94 0 - 99 mg/dL   Total CHOL/HDL Ratio 5    NonHDL 125.05     Assessment and Plan:     ICD-10-CM   1. Healthcare maintenance  Z00.00    Work on diet, exercise, and fitness - doing a good job  Start Prozac with follow-up, new depression / anxiety sx with  irritability  Health Maintenance Exam: The patient's preventative maintenance and recommended screening tests for an annual wellness exam were reviewed in full today. Brought up to date unless services declined.  Counselled on the importance of diet, exercise, and its role in overall health and mortality. The patient's FH and SH was reviewed, including their home life, tobacco status, and drug and alcohol status.  Follow-up in 1 year for physical exam or additional follow-up below.  Follow-up: No follow-ups on file. Or follow-up in 1 year if not noted.  No orders of the defined types were placed in this encounter.  There are no discontinued medications. No orders of the defined types were placed in this encounter.   Signed,  Maud Deed. Tavarion Babington, MD   Allergies as of 07/14/2020   No Known Allergies     Medication List       Accurate as of Jul 14, 2020  8:30 AM. If you have any questions, ask your nurse or doctor.        atorvastatin 40 MG tablet Commonly known as: LIPITOR TAKE 1 TABLET DAILY AT 6 PM   LORazepam 0.5 MG tablet Commonly known as: ATIVAN Take 1 tablet (0.5 mg total) by mouth every 8 (eight) hours as needed for anxiety.   losartan 100 MG tablet Commonly known as: COZAAR TAKE 1 TABLET BY MOUTH EVERY DAY   multivitamin tablet Take 1 tablet by mouth daily.   pantoprazole 40 MG tablet Commonly known as: PROTONIX TAKE 1 TABLET BY MOUTH EVERY DAY   tamsulosin 0.4 MG Caps capsule Commonly known as: FLOMAX Take 1 capsule (0.4 mg total) by mouth daily.

## 2020-07-14 ENCOUNTER — Other Ambulatory Visit: Payer: Self-pay

## 2020-07-14 ENCOUNTER — Encounter: Payer: Self-pay | Admitting: Family Medicine

## 2020-07-14 ENCOUNTER — Ambulatory Visit (INDEPENDENT_AMBULATORY_CARE_PROVIDER_SITE_OTHER): Payer: 59 | Admitting: Family Medicine

## 2020-07-14 VITALS — BP 124/90 | HR 60 | Temp 98.0°F | Ht 69.0 in | Wt 210.5 lb

## 2020-07-14 DIAGNOSIS — Z0000 Encounter for general adult medical examination without abnormal findings: Secondary | ICD-10-CM | POA: Diagnosis not present

## 2020-07-14 MED ORDER — FLUOXETINE HCL 20 MG PO CAPS: 20.0000 mg | ORAL_CAPSULE | Freq: Every day | ORAL | 1 refills | Status: DC

## 2020-07-14 NOTE — Patient Instructions (Signed)
Prozac 20 mg, for one week, take every other day, then increase to 1 full capsule daily

## 2020-07-16 ENCOUNTER — Ambulatory Visit: Payer: 59 | Admitting: Psychology

## 2020-07-21 ENCOUNTER — Other Ambulatory Visit: Payer: Self-pay | Admitting: Internal Medicine

## 2020-07-28 ENCOUNTER — Other Ambulatory Visit: Payer: Self-pay | Admitting: Family Medicine

## 2020-07-28 DIAGNOSIS — E7800 Pure hypercholesterolemia, unspecified: Secondary | ICD-10-CM

## 2020-08-25 ENCOUNTER — Ambulatory Visit: Payer: 59 | Admitting: Family Medicine

## 2020-08-25 ENCOUNTER — Other Ambulatory Visit: Payer: Self-pay

## 2020-08-25 ENCOUNTER — Encounter: Payer: Self-pay | Admitting: Family Medicine

## 2020-08-25 VITALS — BP 140/80 | HR 72 | Temp 98.3°F | Ht 69.0 in | Wt 206.5 lb

## 2020-08-25 DIAGNOSIS — H5789 Other specified disorders of eye and adnexa: Secondary | ICD-10-CM | POA: Diagnosis not present

## 2020-08-25 DIAGNOSIS — F320 Major depressive disorder, single episode, mild: Secondary | ICD-10-CM | POA: Diagnosis not present

## 2020-08-25 DIAGNOSIS — F411 Generalized anxiety disorder: Secondary | ICD-10-CM | POA: Diagnosis not present

## 2020-08-25 NOTE — Progress Notes (Signed)
Dinh Ayotte T. Kasem Mozer, MD, [place] at [facility] [place], [address]  Phone: [phone]  FAX: [place] - 60 y.o. male  MRN [id number]  Date of Birth: 1960-06-20  Date: 08/25/2020  PCP: Owens Loffler, MD  Referral: Owens Loffler, MD  Chief Complaint  Patient presents with   Follow-up    This visit occurred during the SARS-CoV-2 public health emergency.  Safety protocols were in place, including screening questions prior to the visit, additional usage of staff PPE, and extensive cleaning of exam room while observing appropriate contact time as indicated for disinfecting solutions.   Subjective:   Willie Johnson is a 60 y.o. very pleasant male patient with Body mass index is 30.49 kg/m. who presents with the following:  The last time I saw him, I did start him on Prozac 20 and wanted to have a 6-week follow-up.  All dep, anxiety, doing much better.  Irritability is also much better. This is his first episode with either depression or anxiety. He is not having any problems at home, his husband does tell him that he has not really been irritable at all.  He feels steady at this point.  He did have some nausea for the first couple of weeks, but that is completely resolved at this point  L eye.   Was in the pool.  Stinging in the pool.   No photophonia.  He did have some puffiness at the left eye.  Review of Systems is noted in the HPI, as appropriate  Objective:   BP 140/80   Pulse 72   Temp 98.3 F (36.8 C) (Temporal)   Ht 5\' 9"  (1.753 m)   Wt 206 lb 8 oz (93.7 kg)   SpO2 98%   BMI 30.49 kg/m   GEN: No acute distress; alert,appropriate. PULM: Breathing comfortably in no respiratory distress PSYCH: Normally interactive.   Both eyes have some minimal injection, but there is no pink conjunctiva.  Extraocular movements intact.  Slight puffiness at left eyelid.  Laboratory  and Imaging Data:  Assessment and Plan:     ICD-10-CM   1. Depression, major, single episode, mild ([place])  F32.0     2. Generalized anxiety disorder  F41.1     3. Irritation of left eye  H57.89      He is doing much better in terms of some depression and anxiety, and I recommended that he stay on an SSRI for 6 months and then he should be fine to stop it.  Left eye, in the pool he did have some irritation over the weekend.  It is starting to get better at this point.  I do not think that he needs to do anything at all unless it gets more painful, he develops photophobia, and it becomes more injected.  Signed,  Maud Deed. Eddie Payette, MD   Outpatient Encounter Medications as of 08/25/2020  Medication Sig   atorvastatin (LIPITOR) 40 MG tablet TAKE 1 TABLET DAILY AT 6PM   FLUoxetine (PROZAC) 20 MG capsule Take 1 capsule (20 mg total) by mouth daily.   LORazepam (ATIVAN) 0.5 MG tablet Take 1 tablet (0.5 mg total) by mouth every 8 (eight) hours as needed for anxiety.   losartan (COZAAR) 100 MG tablet TAKE 1 TABLET BY MOUTH EVERY DAY   Multiple Vitamin (MULTIVITAMIN) tablet Take 1 tablet by mouth daily.   pantoprazole (PROTONIX) 40 MG tablet TAKE 1 TABLET BY MOUTH  EVERY DAY   tamsulosin (FLOMAX) 0.4 MG CAPS capsule Take 1 capsule (0.4 mg total) by mouth daily.   No facility-administered encounter medications on file as of 08/25/2020.

## 2021-01-05 ENCOUNTER — Other Ambulatory Visit: Payer: Self-pay | Admitting: Family Medicine

## 2021-05-27 ENCOUNTER — Telehealth: Payer: Self-pay | Admitting: Family Medicine

## 2021-06-02 ENCOUNTER — Encounter: Payer: Self-pay | Admitting: Adult Health

## 2021-06-02 ENCOUNTER — Ambulatory Visit: Payer: 59 | Admitting: Adult Health

## 2021-06-02 VITALS — BP 140/92 | HR 85 | Temp 98.7°F | Wt 215.0 lb

## 2021-06-02 DIAGNOSIS — E119 Type 2 diabetes mellitus without complications: Secondary | ICD-10-CM

## 2021-06-02 DIAGNOSIS — I10 Essential (primary) hypertension: Secondary | ICD-10-CM | POA: Diagnosis not present

## 2021-06-02 DIAGNOSIS — Z7689 Persons encountering health services in other specified circumstances: Secondary | ICD-10-CM

## 2021-06-02 DIAGNOSIS — F39 Unspecified mood [affective] disorder: Secondary | ICD-10-CM

## 2021-06-02 DIAGNOSIS — E7800 Pure hypercholesterolemia, unspecified: Secondary | ICD-10-CM

## 2021-06-02 LAB — POCT GLYCOSYLATED HEMOGLOBIN (HGB A1C): Hemoglobin A1C: 5.9 % — AB (ref 4.0–5.6)

## 2021-06-02 MED ORDER — AMLODIPINE BESYLATE 2.5 MG PO TABS: 2.5000 mg | ORAL_TABLET | Freq: Every day | ORAL | 0 refills | Status: DC

## 2021-06-02 NOTE — Progress Notes (Signed)
? ? ?Patient presents to clinic today to establish care. He is a pleasant 61 year old male who  has a past medical history of Allergic rhinitis due to pollen, Diverticulosis, GERD (gastroesophageal reflux disease), HTN (hypertension), Hyperlipidemia, and Type 2 diabetes mellitus without complication, without long-term current use of insulin ([place]) (04/16/2018). ? ?He is a former patient of Dr. Edilia Bo  ? ?His last CPE was in May 2022 ? ?Acute Concerns: ?Establish Care ? ?Chronic Issues: ?Mood Disorder-takes Prozac 20 mg daily.  Feels as though this controls his symptoms well.  Helps with irritability. ? ?Hyperlipidemia-takes Lipitor 40 mg every evening.  He denies myalgia or fatigue ?Lab Results  ?Component Value Date  ? CHOL 158 07/07/2020  ? HDL 32.90 (L) 07/07/2020  ? [place] 94 07/07/2020  ? LDLDIRECT 111.0 04/11/2018  ? TRIG 157.0 (H) 07/07/2020  ? CHOLHDL 5 07/07/2020  ? ?Hypertension-well managed with Cozaar 100 mg daily.  He denies dizziness, lightheadedness, chest pain, or shortness of breath. He does check his BP at home and reports readings in the high 130's-140's/80-90's.  ?BP Readings from Last 3 Encounters:  ?06/02/21 (!) 140/92  ?08/25/20 140/80  ?07/14/20 124/90  ? ? ?DM Type 2 -diet controlled.  Not currently on any medication.  Last A1c was 6.0. He reports that he does check his blood sugars at home and reports readings in the 120s in the morning and 170's at night.  ?Lab Results  ?Component Value Date  ? HGBA1C 6.0 07/07/2020  ? ? ?Health Maintenance: ?Dental -- Routine Care  ?Vision -- Routine  ?Immunizations -- ?Colonoscopy -- 2015 - 10 year plan  ? ? ? ?Past Medical History:  ?Diagnosis Date  ? Allergic rhinitis due to pollen   ? Diverticulosis   ? GERD (gastroesophageal reflux disease)   ? HTN (hypertension)   ? Hyperlipidemia   ? Type 2 diabetes mellitus without complication, without long-term current use of insulin ([place]) 04/16/2018  ? ? ?Past Surgical History:  ?Procedure Laterality Date  ?  KNEE SURGERY    ? SCAPHOID FRACTURE SURGERY    ? ? ?Current Outpatient Medications on File Prior to Visit  ?Medication Sig Dispense Refill  ? atorvastatin (LIPITOR) 40 MG tablet TAKE 1 TABLET DAILY AT 6PM 90 tablet 3  ? FLUoxetine (PROZAC) 20 MG capsule TAKE 1 CAPSULE BY MOUTH EVERY DAY 90 capsule 1  ? losartan (COZAAR) 100 MG tablet TAKE 1 TABLET BY MOUTH EVERY DAY 90 tablet 3  ? Multiple Vitamin (MULTIVITAMIN) tablet Take 1 tablet by mouth daily.    ? ?No current facility-administered medications on file prior to visit.  ? ? ?No Known Allergies ? ?Family History  ?Problem Relation Age of Onset  ? Arthritis Mother   ? Hyperlipidemia Mother   ? Heart disease Mother   ?     Valve problem and CHF  ? Hypertension Mother   ? Diabetes Mother   ? Hyperlipidemia Father   ? Heart disease Father   ? Stroke Father   ?     CVA   ? Pulmonary embolism Sister   ? Colon cancer Neg Hx   ? Pancreatic cancer Neg Hx   ? Stomach cancer Neg Hx   ? Colon polyps Neg Hx   ? Rectal cancer Neg Hx   ? ? ?Social History  ? ?Socioeconomic History  ? Marital status: Married  ?  Spouse name: Not on file  ? Number of children: Not on file  ? Years of education: Not  on file  ? Highest education level: Not on file  ?Occupational History  ? Occupation: software  ?Tobacco Use  ? Smoking status: Former  ?  Packs/day: 0.50  ?  Years: 20.00  ?  Pack years: 10.00  ?  Types: Cigarettes  ?  Quit date: 07/01/2017  ?  Years since quitting: 3.9  ? Smokeless tobacco: Never  ? Tobacco comments:  ?  no vapes   ?Vaping Use  ? Vaping Use: Never used  ?Substance and Sexual Activity  ? Alcohol use: No  ?  Alcohol/week: 0.0 standard drinks  ? Drug use: No  ? Sexual activity: Yes  ?  Partners: Male  ?Other Topics Concern  ? Not on file  ?Social History Narrative  ? 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[employer]  ? Partner of many years 61 who is also my patient  ? ?Social Determinants of Health  ? ?Financial Resource Strain: Not on file  ?Food Insecurity: Not on  file  ?Transportation Needs: Not on file  ?Physical Activity: Not on file  ?Stress: Not on file  ?Social Connections: Not on file  ?Intimate Partner Violence: Not on file  ? ? ?Review of Systems  ?Constitutional: Negative.   ?HENT: Negative.    ?Eyes: Negative.   ?Respiratory: Negative.    ?Cardiovascular: Negative.   ?Gastrointestinal: Negative.   ?Genitourinary: Negative.   ?Musculoskeletal: Negative.   ?Skin: Negative.   ?Neurological: Negative.   ?Endo/Heme/Allergies: Negative.   ?Psychiatric/Behavioral: Negative.    ?All other systems reviewed and are negative. ? ?BP (!) 140/92   Pulse 85   Temp 98.7 ?F (37.1 ?C) (Oral)   Wt 215 lb (97.5 kg)   SpO2 96%   BMI 31.75 kg/m?  ? ?Physical Exam ?Vitals and nursing note reviewed.  ?Cardiovascular:  ?   Rate and Rhythm: Normal rate and regular rhythm.  ?   Pulses: Normal pulses.  ?   Heart sounds: Normal heart sounds.  ?Pulmonary:  ?   Effort: Pulmonary effort is normal.  ?   Breath sounds: Normal breath sounds.  ?Musculoskeletal:     ?   General: Normal range of motion.  ?Skin: ?   General: Skin is warm and dry.  ?   Capillary Refill: Capillary refill takes less than 2 seconds.  ?Neurological:  ?   General: No focal deficit present.  ?   Mental Status: He is oriented to person, place, and time.  ?Psychiatric:     ?   Mood and Affect: Mood normal.     ?   Behavior: Behavior normal.     ?   Thought Content: Thought content normal.     ?   Judgment: Judgment normal.  ? ? ?Assessment/Plan: ?1. Encounter to establish care ?- Follow up in 6 weeks for CPE or sooner if needd ?- Work on lifestyle modifications  ? ?2. Pure hypercholesterolemia ?- Continue stain  ? ?3. Diabetes mellitus without complication ([place]) ? ?- POC HgB A1c- 5.9 ?- Samples of Libre system given  ? ?4. Essential hypertension ?- Will add Norvasc 2.5 mg to his regimen  ?- Follow up at CPE  ? ?5. Mood disorder ([place]) ?- Continue PRozac  ? ? ?Dorothyann Peng, NP ? ?

## 2021-06-02 NOTE — Patient Instructions (Signed)
It was great meeting you today  ? ?Please follow up in early may for your physical exam  ? ?[place]  ? ?Address: [address] [place], [place] [address] ?Phone: [phone] ? ?I had added Norvasc 2.5 mg to your regimen for blood pressure control  ?

## 2021-06-03 ENCOUNTER — Encounter: Payer: Self-pay | Admitting: Family Medicine

## 2021-06-19 NOTE — Telephone Encounter (Signed)
error 

## 2021-06-21 ENCOUNTER — Other Ambulatory Visit: Payer: Self-pay | Admitting: Family Medicine

## 2021-07-06 ENCOUNTER — Other Ambulatory Visit: Payer: Self-pay | Admitting: Family Medicine

## 2021-07-06 ENCOUNTER — Encounter: Payer: Self-pay | Admitting: Adult Health

## 2021-07-07 ENCOUNTER — Other Ambulatory Visit: Payer: Self-pay | Admitting: Adult Health

## 2021-07-07 MED ORDER — FLUOXETINE HCL 20 MG PO CAPS: ORAL_CAPSULE | ORAL | 1 refills | Status: DC

## 2021-07-15 ENCOUNTER — Other Ambulatory Visit: Payer: Self-pay | Admitting: Adult Health

## 2021-07-15 ENCOUNTER — Ambulatory Visit (INDEPENDENT_AMBULATORY_CARE_PROVIDER_SITE_OTHER): Payer: 59

## 2021-07-15 ENCOUNTER — Ambulatory Visit (INDEPENDENT_AMBULATORY_CARE_PROVIDER_SITE_OTHER): Payer: 59 | Admitting: Adult Health

## 2021-07-15 ENCOUNTER — Encounter: Payer: Self-pay | Admitting: Adult Health

## 2021-07-15 VITALS — BP 130/80 | HR 63 | Temp 97.9°F | Ht 69.0 in | Wt 214.0 lb

## 2021-07-15 DIAGNOSIS — I10 Essential (primary) hypertension: Secondary | ICD-10-CM | POA: Diagnosis not present

## 2021-07-15 DIAGNOSIS — E119 Type 2 diabetes mellitus without complications: Secondary | ICD-10-CM | POA: Diagnosis not present

## 2021-07-15 DIAGNOSIS — F39 Unspecified mood [affective] disorder: Secondary | ICD-10-CM

## 2021-07-15 DIAGNOSIS — E7800 Pure hypercholesterolemia, unspecified: Secondary | ICD-10-CM

## 2021-07-15 DIAGNOSIS — Z23 Encounter for immunization: Secondary | ICD-10-CM | POA: Diagnosis not present

## 2021-07-15 DIAGNOSIS — R319 Hematuria, unspecified: Secondary | ICD-10-CM | POA: Diagnosis not present

## 2021-07-15 DIAGNOSIS — Z0001 Encounter for general adult medical examination with abnormal findings: Secondary | ICD-10-CM | POA: Diagnosis not present

## 2021-07-15 DIAGNOSIS — M545 Low back pain, unspecified: Secondary | ICD-10-CM | POA: Diagnosis not present

## 2021-07-15 DIAGNOSIS — Z125 Encounter for screening for malignant neoplasm of prostate: Secondary | ICD-10-CM | POA: Diagnosis not present

## 2021-07-15 LAB — CBC WITH DIFFERENTIAL/PLATELET
Basophils Absolute: 0 10*3/uL (ref 0.0–0.1)
Basophils Relative: 0.5 % (ref 0.0–3.0)
Eosinophils Absolute: 0.3 10*3/uL (ref 0.0–0.7)
Eosinophils Relative: 3.7 % (ref 0.0–5.0)
HCT: 42 % (ref 39.0–52.0)
Hemoglobin: 14.5 g/dL (ref 13.0–17.0)
Lymphocytes Relative: 21.5 % (ref 12.0–46.0)
Lymphs Abs: 1.9 10*3/uL (ref 0.7–4.0)
MCHC: 34.4 g/dL (ref 30.0–36.0)
MCV: 86.8 fl (ref 78.0–100.0)
Monocytes Absolute: 0.7 10*3/uL (ref 0.1–1.0)
Monocytes Relative: 7.5 % (ref 3.0–12.0)
Neutro Abs: 5.9 10*3/uL (ref 1.4–7.7)
Neutrophils Relative %: 66.8 % (ref 43.0–77.0)
Platelets: 276 10*3/uL (ref 150.0–400.0)
RBC: 4.84 Mil/uL (ref 4.22–5.81)
RDW: 14.4 % (ref 11.5–15.5)
WBC: 8.8 10*3/uL (ref 4.0–10.5)

## 2021-07-15 LAB — LIPID PANEL
Cholesterol: 177 mg/dL (ref 0–200)
HDL: 39.1 mg/dL (ref >39.00)
LDL Cholesterol: 98 mg/dL (ref 0–99)
NonHDL: 137.85
Total CHOL/HDL Ratio: 5
Triglycerides: 198 mg/dL — ABNORMAL HIGH (ref 0.0–149.0)
VLDL: 39.6 mg/dL (ref 0.0–40.0)

## 2021-07-15 LAB — URINALYSIS, ROUTINE W REFLEX MICROSCOPIC
Bilirubin Urine: NEGATIVE
Hgb urine dipstick: NEGATIVE
Ketones, ur: NEGATIVE
Leukocytes,Ua: NEGATIVE
Nitrite: NEGATIVE
RBC / HPF: NONE SEEN (ref 0–?)
Specific Gravity, Urine: <=1.005 — AB (ref 1.000–1.030)
Total Protein, Urine: NEGATIVE
Urine Glucose: NEGATIVE
Urobilinogen, UA: 0.2 (ref 0.0–1.0)
WBC, UA: NONE SEEN (ref 0–?)
pH: 7 (ref 5.0–8.0)

## 2021-07-15 LAB — COMPREHENSIVE METABOLIC PANEL
ALT: 34 U/L (ref 0–53)
AST: 19 U/L (ref 0–37)
Albumin: 4.3 g/dL (ref 3.5–5.2)
Alkaline Phosphatase: 89 U/L (ref 39–117)
BUN: 10 mg/dL (ref 6–23)
CO2: 26 mEq/L (ref 19–32)
Calcium: 8.9 mg/dL (ref 8.4–10.5)
Chloride: 101 mEq/L (ref 96–112)
Creatinine, Ser: 0.9 mg/dL (ref 0.40–1.50)
GFR: 93.62 mL/min (ref >60.00)
Glucose, Bld: 85 mg/dL (ref 70–99)
Potassium: 4.1 mEq/L (ref 3.5–5.1)
Sodium: 137 mEq/L (ref 135–145)
Total Bilirubin: 0.6 mg/dL (ref 0.2–1.2)
Total Protein: 6.9 g/dL (ref 6.0–8.3)

## 2021-07-15 LAB — TSH: TSH: 1.83 u[IU]/mL (ref 0.35–5.50)

## 2021-07-15 LAB — HEMOGLOBIN A1C: Hgb A1c MFr Bld: 6.1 % (ref 4.6–6.5)

## 2021-07-15 LAB — PSA: PSA: 0.55 ng/mL (ref 0.10–4.00)

## 2021-07-15 LAB — MICROALBUMIN / CREATININE URINE RATIO
Creatinine,U: 82.2 mg/dL
Microalb Creat Ratio: 0.9 mg/g (ref 0.0–30.0)
Microalb, Ur: <0.7 mg/dL (ref 0.0–1.9)

## 2021-07-15 MED ORDER — AMLODIPINE BESYLATE 2.5 MG PO TABS: 2.5000 mg | ORAL_TABLET | Freq: Every day | ORAL | 3 refills | Status: DC

## 2021-07-15 MED ORDER — ATORVASTATIN CALCIUM 40 MG PO TABS: ORAL_TABLET | ORAL | 3 refills | Status: DC

## 2021-07-15 NOTE — Patient Instructions (Addendum)
It was great seeing you today   We will follow up with you regarding your lab work   Please let me know if you need anything   

## 2021-07-15 NOTE — Progress Notes (Signed)
? ?Subjective:  ? ? Patient ID: Willie Johnson, male    DOB: 1960/10/13, 61 y.o.   MRN: [id number] ? ?HPI ?Patient presents for yearly preventative medicine examination. He is a pleasant 61 year old male who  has a past medical history of Allergic rhinitis due to pollen, Diverticulosis, GERD (gastroesophageal reflux disease), HTN (hypertension), Hyperlipidemia, and Type 2 diabetes mellitus without complication, without long-term current use of insulin ([place]) (04/16/2018). ? ?Mood disorder-takes is at 20 mg daily.  Feels as though this controls his symptoms well. ? ?Hyperlipidemia-managed with Lipitor 40 mg daily.  He denies myalgia or fatigue. He has not experienced any chest pain or shortness of breath. He would like to have a CT calcium score done.  ? ?Lab Results  ?Component Value Date  ? CHOL 158 07/07/2020  ? HDL 32.90 (L) 07/07/2020  ? [place] 94 07/07/2020  ? LDLDIRECT 111.0 04/11/2018  ? TRIG 157.0 (H) 07/07/2020  ? CHOLHDL 5 07/07/2020  ? ?Hypertension-managed with Cozaar 100 mg daily and norvasc 2.5 mg daily.  He denies dizziness, lightheadedness, chest pain, or shortness of breath.  He does check his blood pressure at home and reports readings in the high 130's to 140s over 80s to 90s ? ?BP Readings from Last 3 Encounters:  ?07/15/21 130/80  ?06/02/21 (!) 140/92  ?08/25/20 140/80  ? ?Diabetes mellitus type 2-diet controlled.  Not currently on any medication.  His last A1c was 5.9.  He does check his blood sugars at home and reports readings in the 120s in the morning and 170s at night ?Lab Results  ?Component Value Date  ? HGBA1C 5.9 (A) 06/02/2021  ? ?Acute Issues  ? ?Low back pain - reports that over the last month he has been experiencing low back pain/sacral pain when sitting and laying in bed in certain positions. Denies trauma or aggravating injury.  ?Hematuria - about a week ago, he noticed that his urine was pink and then passed a blood clot and then his urine turned back to clear. Prior to the  blood in his urine he ejaculated. He denies any trauma or injury prior. No sporting activities that were done 24 hours prior. He has not had any symptoms since. He does have a h/o kidney stones but did not feel any pain during this episode.  ? ?All immunizations and health maintenance protocols were reviewed with the patient and needed orders were placed. ? ?Appropriate screening laboratory values were ordered for the patient including screening of hyperlipidemia, renal function and hepatic function. ?If indicated by BPH, a PSA was ordered. ? ?Medication reconciliation,  past medical history, social history, problem list and allergies were reviewed in detail with the patient ? ?Goals were established with regard to weight loss, exercise, and  diet in compliance with medications ? ?He is up-to-date on routine colon cancer screening ?Review of Systems  ?Constitutional: Negative.   ?HENT: Negative.    ?Eyes: Negative.   ?Respiratory: Negative.    ?Cardiovascular: Negative.   ?Gastrointestinal: Negative.   ?Endocrine: Negative.  Negative for polydipsia.  ?Genitourinary:  Positive for hematuria.  ?Musculoskeletal:  Positive for back pain.  ?Skin: Negative.   ?Allergic/Immunologic: Negative.   ?Neurological: Negative.   ?Hematological: Negative.   ?Psychiatric/Behavioral: Negative.    ?All other systems reviewed and are negative. ? ?Past Medical History:  ?Diagnosis Date  ? Allergic rhinitis due to pollen   ? Diverticulosis   ? GERD (gastroesophageal reflux disease)   ? HTN (hypertension)   ? Hyperlipidemia   ?  Type 2 diabetes mellitus without complication, without long-term current use of insulin ([place]) 04/16/2018  ? ? ?Social History  ? ?Socioeconomic History  ? Marital status: Married  ?  Spouse name: Not on file  ? Number of children: Not on file  ? Years of education: Not on file  ? Highest education level: Not on file  ?Occupational History  ? Occupation: software  ?Tobacco Use  ? Smoking status: Former  ?  Packs/day:  0.50  ?  Years: 20.00  ?  Pack years: 10.00  ?  Types: Cigarettes  ?  Quit date: 07/01/2017  ?  Years since quitting: 4.0  ? Smokeless tobacco: Never  ? Tobacco comments:  ?  no vapes   ?Vaping Use  ? Vaping Use: Never used  ?Substance and Sexual Activity  ? Alcohol use: No  ?  Alcohol/week: 0.0 standard drinks  ? Drug use: No  ? Sexual activity: Yes  ?  Partners: Male  ?Other Topics Concern  ? Not on file  ?Social History Narrative  ? 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[employer]  ? ?Social Determinants of Health  ? ?Financial Resource Strain: Not on file  ?Food Insecurity: Not on file  ?Transportation Needs: Not on file  ?Physical Activity: Not on file  ?Stress: Not on file  ?Social Connections: Not on file  ?Intimate Partner Violence: Not on file  ? ? ?Past Surgical History:  ?Procedure Laterality Date  ? KNEE SURGERY Left 03/2001  ? lgament tear  ? SCAPHOID FRACTURE SURGERY Right 03/2006  ? ? ?Family History  ?Problem Relation Age of Onset  ? Arthritis Mother   ? Hyperlipidemia Mother   ? Heart disease Mother   ?     Valve problem and CHF  ? Hypertension Mother   ? Diabetes Mother   ? Hyperlipidemia Father   ? Heart disease Father   ? Stroke Father   ?     CVA   ? Pulmonary embolism Sister   ? Colon cancer Neg Hx   ? Pancreatic cancer Neg Hx   ? Stomach cancer Neg Hx   ? Colon polyps Neg Hx   ? Rectal cancer Neg Hx   ? ? ?No Known Allergies ? ?Current Outpatient Medications on File Prior to Visit  ?Medication Sig Dispense Refill  ? amLODipine (NORVASC) 2.5 MG tablet Take 1 tablet (2.5 mg total) by mouth daily. 90 tablet 0  ? atorvastatin (LIPITOR) 40 MG tablet TAKE 1 TABLET DAILY AT 6PM 90 tablet 3  ? FLUoxetine (PROZAC) 20 MG capsule TAKE 1 CAPSULE BY MOUTH EVERY DAY 90 capsule 1  ? losartan (COZAAR) 100 MG tablet TAKE 1 TABLET BY MOUTH EVERY DAY 90 tablet 3  ? Multiple Vitamin (MULTIVITAMIN) tablet Take 1 tablet by mouth daily.    ? ?No current facility-administered medications on file prior to visit.   ? ? ?BP 130/80   Pulse 63   Temp 97.9 ?F (36.6 ?C) (Oral)   Ht '5\' 9"'$  (1.753 m)   Wt 214 lb (97.1 kg)   SpO2 98%   BMI 31.60 kg/m?  ? ? ?   ?Objective:  ? Physical Exam ?Vitals and nursing note reviewed.  ?Constitutional:   ?   General: He is not in acute distress. ?   Appearance: Normal appearance. He is well-developed and normal weight.  ?HENT:  ?   Head: Normocephalic and atraumatic.  ?   Right Ear: Tympanic membrane, ear canal and external ear normal. There  is no impacted cerumen.  ?   Left Ear: Tympanic membrane, ear canal and external ear normal. There is no impacted cerumen.  ?   Nose: Nose normal. No congestion or rhinorrhea.  ?   Mouth/Throat:  ?   Mouth: Mucous membranes are moist.  ?   Pharynx: Oropharynx is clear. No oropharyngeal exudate or posterior oropharyngeal erythema.  ?Eyes:  ?   General:     ?   Right eye: No discharge.     ?   Left eye: No discharge.  ?   Extraocular Movements: Extraocular movements intact.  ?   Conjunctiva/sclera: Conjunctivae normal.  ?   Pupils: Pupils are equal, round, and reactive to light.  ?Neck:  ?   Vascular: No carotid bruit.  ?   Trachea: No tracheal deviation.  ?Cardiovascular:  ?   Rate and Rhythm: Normal rate and regular rhythm.  ?   Pulses: Normal pulses.  ?   Heart sounds: Normal heart sounds. No murmur heard. ?  No friction rub. No gallop.  ?Pulmonary:  ?   Effort: Pulmonary effort is normal. No respiratory distress.  ?   Breath sounds: Normal breath sounds. No stridor. No wheezing, rhonchi or rales.  ?Chest:  ?   Chest wall: No tenderness.  ?Abdominal:  ?   General: Bowel sounds are normal. There is no distension.  ?   Palpations: Abdomen is soft. There is no mass.  ?   Tenderness: There is no abdominal tenderness. There is no right CVA tenderness, left CVA tenderness, guarding or rebound.  ?   Hernia: No hernia is present.  ?Musculoskeletal:     ?   General: No swelling, tenderness, deformity or signs of injury. Normal range of motion.  ?   Right lower  leg: No edema.  ?   Left lower leg: No edema.  ?Lymphadenopathy:  ?   Cervical: No cervical adenopathy.  ?Skin: ?   General: Skin is warm and dry.  ?   Capillary Refill: Capillary refill takes less than 2 second

## 2021-07-20 ENCOUNTER — Other Ambulatory Visit: Payer: 59

## 2021-07-21 ENCOUNTER — Ambulatory Visit
Admission: RE | Admit: 2021-07-21 | Discharge: 2021-07-21 | Disposition: A | Discharge status: Home / Self Care | Payer: 59 | Source: Ambulatory Visit | Attending: Adult Health | Admitting: Adult Health

## 2021-07-21 DIAGNOSIS — R319 Hematuria, unspecified: Secondary | ICD-10-CM

## 2021-09-03 ENCOUNTER — Ambulatory Visit
Admission: RE | Admit: 2021-09-03 | Discharge: 2021-09-03 | Disposition: A | Discharge status: Home / Self Care | Payer: Self-pay | Source: Ambulatory Visit | Attending: Adult Health | Admitting: Adult Health

## 2021-09-03 DIAGNOSIS — E7800 Pure hypercholesterolemia, unspecified: Secondary | ICD-10-CM

## 2021-09-04 ENCOUNTER — Telehealth: Payer: Self-pay | Admitting: Adult Health

## 2021-09-04 DIAGNOSIS — R9389 Abnormal findings on diagnostic imaging of other specified body structures: Secondary | ICD-10-CM

## 2021-09-04 NOTE — Telephone Encounter (Signed)
Updated patient on his CT Cardiac Calcium score   His score was 41.6 - will keep him on lipitor   Incidental finding of   1. Patchy areas of ground-glass attenuation in the lung bases bilaterally concerning for potential interstitial lung disease. Outpatient referral to Pulmonology for further clinical evaluation is recommended.  He denies symptoms.  - Will refer to pulmonary

## 2021-09-18 LAB — HM DIABETES EYE EXAM

## 2021-09-23 ENCOUNTER — Encounter: Payer: Self-pay | Admitting: Adult Health

## 2021-09-28 NOTE — Progress Notes (Unsigned)
Synopsis: Referred for abnormal CT scan by Dorothyann Peng, NP  Subjective:   PATIENT ID: Willie Johnson GENDER: male DOB: 07/13/1960, MRN: [id number]  No chief complaint on file.  61yM with history of GERD, AR, HTN, HLD, smoking referred for abnormal CT Chest  Otherwise pertinent review of systems is negative.  Past Medical History:  Diagnosis Date   Allergic rhinitis due to pollen    Diverticulosis    GERD (gastroesophageal reflux disease)    HTN (hypertension)    Hyperlipidemia    Type 2 diabetes mellitus without complication, without long-term current use of insulin ([place]) 04/16/2018     Family History  Problem Relation Age of Onset   Arthritis Mother    Hyperlipidemia Mother    Heart disease Mother        Valve problem and CHF   Hypertension Mother    Diabetes Mother    Hyperlipidemia Father    Heart disease Father    Stroke Father        CVA    Pulmonary embolism Sister    Colon cancer Neg Hx    Pancreatic cancer Neg Hx    Stomach cancer Neg Hx    Colon polyps Neg Hx    Rectal cancer Neg Hx      Past Surgical History:  Procedure Laterality Date   KNEE SURGERY Left 03/2001   lgament tear   SCAPHOID FRACTURE SURGERY Right 03/2006    Social History   Socioeconomic History   Marital status: Married    Spouse name: Not on file   Number of children: Not on file   Years of education: Not on file   Highest education level: Not on file  Occupational History   Occupation: software  Tobacco Use   Smoking status: Former    Packs/day: 0.50    Years: 20.00    Total pack years: 10.00    Types: Cigarettes    Quit date: 07/01/2017    Years since quitting: 4.2   Smokeless tobacco: Never   Tobacco comments:    no vapes   Vaping Use   Vaping Use: Never used  Substance and Sexual Activity   Alcohol use: No    Alcohol/week: 0.0 standard drinks of alcohol   Drug use: No   Sexual activity: Yes    Partners: Male  Other Topics Concern   Not on file  Social  History Narrative   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[employer]   Social Determinants of Radio broadcast assistant Strain: Not on file  Food Insecurity: Not on file  Transportation Needs: Not on file  Physical Activity: Not on file  Stress: Not on file  Social Connections: Not on file  Intimate Partner Violence: Not on file     No Known Allergies   Outpatient Medications Prior to Visit  Medication Sig Dispense Refill   amLODipine (NORVASC) 2.5 MG tablet Take 1 tablet (2.5 mg total) by mouth daily. 90 tablet 3   atorvastatin (LIPITOR) 40 MG tablet TAKE 1 TABLET DAILY AT 6PM 90 tablet 3   FLUoxetine (PROZAC) 20 MG capsule TAKE 1 CAPSULE BY MOUTH EVERY DAY 90 capsule 1   losartan (COZAAR) 100 MG tablet TAKE 1 TABLET BY MOUTH EVERY DAY 90 tablet 3   Multiple Vitamin (MULTIVITAMIN) tablet Take 1 tablet by mouth daily.     No facility-administered medications prior to visit.       Objective:   Physical Exam:  General appearance: 61  y.o., male, NAD, conversant  Eyes: anicteric sclerae; PERRL, tracking appropriately HENT: NCAT; MMM Neck: Trachea midline; no lymphadenopathy, no JVD Lungs: CTAB, no crackles, no wheeze, with normal respiratory effort CV: RRR, no murmur  Abdomen: Soft, non-tender; non-distended, BS present  Extremities: No peripheral edema, warm Skin: Normal turgor and texture; no rash Psych: Appropriate affect Neuro: Alert and oriented to person and place, no focal deficit     There were no vitals filed for this visit.   on *** LPM *** RA BMI Readings from Last 3 Encounters:  07/15/21 31.60 kg/m  06/02/21 31.75 kg/m  08/25/20 30.49 kg/m   Wt Readings from Last 3 Encounters:  07/15/21 214 lb (97.1 kg)  06/02/21 215 lb (97.5 kg)  08/25/20 206 lb 8 oz (93.7 kg)     CBC    Component Value Date/Time   WBC 8.8 07/15/2021 0905   RBC 4.84 07/15/2021 0905   HGB 14.5 07/15/2021 0905   HCT 42.0 07/15/2021 0905   PLT 276.0 07/15/2021 0905    MCV 86.8 07/15/2021 0905   MCH 29.6 06/22/2018 1632   MCHC 34.4 07/15/2021 0905   RDW 14.4 07/15/2021 0905   LYMPHSABS 1.9 07/15/2021 0905   MONOABS 0.7 07/15/2021 0905   EOSABS 0.3 07/15/2021 0905   BASOSABS 0.0 07/15/2021 0905    ***  Chest Imaging: CT Cardiac scoring 09/03/21 reviewed by me with a few foci of ggo in lung bases  Pulmonary Functions Testing Results:     No data to display          FeNO: ***  Pathology: ***  Echocardiogram: ***  Heart Catheterization: ***    Assessment & Plan:    Plan:      Maryjane Hurter, MD [facility] 09/28/2021 4:13 PM

## 2021-09-30 ENCOUNTER — Ambulatory Visit: Payer: 59 | Admitting: Student

## 2021-09-30 ENCOUNTER — Encounter: Payer: Self-pay | Admitting: Student

## 2021-09-30 VITALS — BP 122/80 | HR 66 | Temp 97.8°F | Ht 69.0 in | Wt 218.2 lb

## 2021-09-30 DIAGNOSIS — R9389 Abnormal findings on diagnostic imaging of other specified body structures: Secondary | ICD-10-CM

## 2021-09-30 NOTE — Patient Instructions (Addendum)
-   If you begin to develop any trouble breathing limiting your exercise despite consistent effort, cough despite measures directed at reflux then come back to see [place] - call or send my chart to get back in clinic - if you think about it more and desire expedited, very thorough workup then we could consider full PFTs (pulmonary function tests) and high resolution CT Chest to see if there is clearly any evidence of interstitial lung disease (lung scarring or lung inflammation)

## 2021-12-30 ENCOUNTER — Other Ambulatory Visit: Payer: Self-pay | Admitting: Adult Health

## 2022-05-14 ENCOUNTER — Ambulatory Visit: Payer: 59 | Admitting: Adult Health

## 2022-05-14 VITALS — BP 130/86 | HR 94 | Temp 98.3°F | Ht 69.0 in | Wt 225.0 lb

## 2022-05-14 DIAGNOSIS — I10 Essential (primary) hypertension: Secondary | ICD-10-CM

## 2022-05-14 DIAGNOSIS — E119 Type 2 diabetes mellitus without complications: Secondary | ICD-10-CM

## 2022-05-14 LAB — POCT GLYCOSYLATED HEMOGLOBIN (HGB A1C): Hemoglobin A1C: 6.4 % — AB (ref 4.0–5.6)

## 2022-05-14 NOTE — Patient Instructions (Signed)
Health Maintenance Due  Topic Date Due   COVID-19 Vaccine (6 - 2023-24 season) 11/06/2021   HEMOGLOBIN A1C  01/15/2022      Row Labels 06/02/2021    2:57 PM 07/14/2020    8:26 AM 07/09/2019    8:16 AM  Depression screen PHQ 2/9   Section Header. No data exists in this row.     Decreased Interest   0 0 0  Down, Depressed, Hopeless   0 0 0  PHQ - 2 Score   0 0 0  Altered sleeping   0    Tired, decreased energy   0    Change in appetite   0    Feeling bad or failure about yourself    0    Trouble concentrating   0    Moving slowly or fidgety/restless   0    Suicidal thoughts   0    PHQ-9 Score   0    Difficult doing work/chores   Not difficult at all

## 2022-05-14 NOTE — Progress Notes (Signed)
Subjective:    Patient ID: Willie Johnson, male    DOB: 11-19-1960, 62 y.o.   MRN: [id number]  HPI 62 year old male who  has a past medical history of Allergic rhinitis due to pollen, Diverticulosis, GERD (gastroesophageal reflux disease), HTN (hypertension), Hyperlipidemia, and Type 2 diabetes mellitus without complication, without long-term current use of insulin ([facility]) (04/16/2018).  He presents to the office today for follow up regarding DM and HTN   DM type 2 -diet controlled.  He does not check his blood sugars at home/ His diet has been poor and he has not been exercising much. The warm months are much better for him when it comes to diet and exercise   Lab Results  Component Value Date   HGBA1C 6.1 07/15/2021   HTN - managed with losartan 100 mg daily and norvasc 2.5 mg daily.  He denies dizziness, lightheadedness, chest pain, or shortness of breath BP Readings from Last 3 Encounters:  05/14/22 130/86  09/30/21 122/80  07/15/21 130/80    Review of Systems See HPI    Past Medical History:  Diagnosis Date   Allergic rhinitis due to pollen    Diverticulosis    GERD (gastroesophageal reflux disease)    HTN (hypertension)    Hyperlipidemia    Type 2 diabetes mellitus without complication, without long-term current use of insulin ([facility]) 04/16/2018    Social History   Socioeconomic History   Marital status: Married    Spouse name: Not on file   Number of children: Not on file   Years of education: Not on file   Highest education level: Bachelor's degree (e.g., BA, AB, BS)  Occupational History   Occupation: software  Tobacco Use   Smoking status: Former    Packs/day: 0.50    Years: 20.00    Total pack years: 10.00    Types: Cigarettes    Quit date: 07/01/2017    Years since quitting: 4.8   Smokeless tobacco: Never   Tobacco comments:    no vapes   Vaping Use   Vaping Use: Never used  Substance and Sexual Activity   Alcohol use: No    Alcohol/week: 0.0  standard drinks of alcohol   Drug use: No   Sexual activity: Yes    Partners: Male  Other Topics Concern   Not on file  Social History Narrative   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[employer] Strain: [place]  (05/13/2022)   Overall Financial Resource Strain (CARDIA)    Difficulty of Paying Living Expenses: Not hard at all  Food Insecurity: No Food Insecurity (05/13/2022)   Hunger Vital Sign    Worried About Running Out of Food in the Last Year: Never true    [place] in the Last Year: Never true  Transportation Needs: No Transportation Needs (05/13/2022)   PRAPARE - Hydrologist (Medical): No    Lack of Transportation (Non-Medical): No  Physical Activity: Unknown (05/13/2022)   Exercise Vital Sign    Days of Exercise per Week: Patient refused    Minutes of Exercise per Session: Not on file  Stress: No Stress Concern Present (05/13/2022)   [place]    Feeling of Stress : Only a little  Social Connections: Moderately Isolated (05/13/2022)   Social Connection and Isolation Panel [NHANES]    Frequency of Communication with  Friends and Family: Twice a week    Frequency of Social Gatherings with Friends and Family: Twice a week    Attends Religious Services: Never    Marine scientist or Organizations: No    Attends Music therapist: Not on file    Marital Status: Married  Human resources officer Violence: Not on file    Past Surgical History:  Procedure Laterality Date   KNEE SURGERY Left 03/2001   lgament tear   SCAPHOID FRACTURE SURGERY Right 03/2006    Family History  Problem Relation Age of Onset   Arthritis Mother    Hyperlipidemia Mother    Heart disease Mother        Valve problem and CHF   Hypertension Mother    Diabetes Mother    Hyperlipidemia Father    Heart disease Father    Stroke Father         CVA    Pulmonary embolism Sister    Colon cancer Neg Hx    Pancreatic cancer Neg Hx    Stomach cancer Neg Hx    Colon polyps Neg Hx    Rectal cancer Neg Hx     No Known Allergies  Current Outpatient Medications on File Prior to Visit  Medication Sig Dispense Refill   amLODipine (NORVASC) 2.5 MG tablet Take 1 tablet (2.5 mg total) by mouth daily. 90 tablet 3   atorvastatin (LIPITOR) 40 MG tablet TAKE 1 TABLET DAILY AT 6PM 90 tablet 3   FLUoxetine (PROZAC) 20 MG capsule TAKE 1 CAPSULE BY MOUTH EVERY DAY 90 capsule 1   losartan (COZAAR) 100 MG tablet TAKE 1 TABLET BY MOUTH EVERY DAY 90 tablet 3   Multiple Vitamin (MULTIVITAMIN) tablet Take 1 tablet by mouth daily.     No current facility-administered medications on file prior to visit.    BP 130/86   Pulse 94   Temp 98.3 F (36.8 C) (Oral)   Ht '5\' 9"'$  (1.753 m)   Wt 225 lb (102.1 kg)   SpO2 97%   BMI 33.23 kg/m       Objective:   Physical Exam Vitals and nursing note reviewed.  Constitutional:      Appearance: Normal appearance.  Cardiovascular:     Rate and Rhythm: Normal rate and regular rhythm.     Pulses: Normal pulses.     Heart sounds: Normal heart sounds.  Pulmonary:     Effort: Pulmonary effort is normal.     Breath sounds: Normal breath sounds.  Skin:    General: Skin is warm and dry.  Neurological:     General: No focal deficit present.     Mental Status: He is alert and oriented to person, place, and time.  Psychiatric:        Mood and Affect: Mood normal.        Behavior: Behavior normal.        Thought Content: Thought content normal.        Judgment: Judgment normal.       Assessment & Plan:   1. Type 2 diabetes mellitus without complication, without long-term current use of insulin ([facility])  - POC HgB A1c- 6.4 - has increased but still at goal  - Work on weight loss through diet and exercise - Will recheck at his CPE in a few months   2. Essential hypertension - At goal.  - No change  in medications   Dorothyann Peng, NP

## 2022-06-12 ENCOUNTER — Other Ambulatory Visit: Payer: Self-pay | Admitting: Adult Health

## 2022-06-12 DIAGNOSIS — I10 Essential (primary) hypertension: Secondary | ICD-10-CM

## 2022-06-12 DIAGNOSIS — E7800 Pure hypercholesterolemia, unspecified: Secondary | ICD-10-CM

## 2022-07-12 IMAGING — DX DG SACRUM/COCCYX 2+V
3 series · 3 of 3 positions shown · non-contrast
Comparison: CT 06/22/2018

CLINICAL DATA: Low back pain

EXAM:
SACRUM AND COCCYX - 2+ VIEW

[sacrum ap]
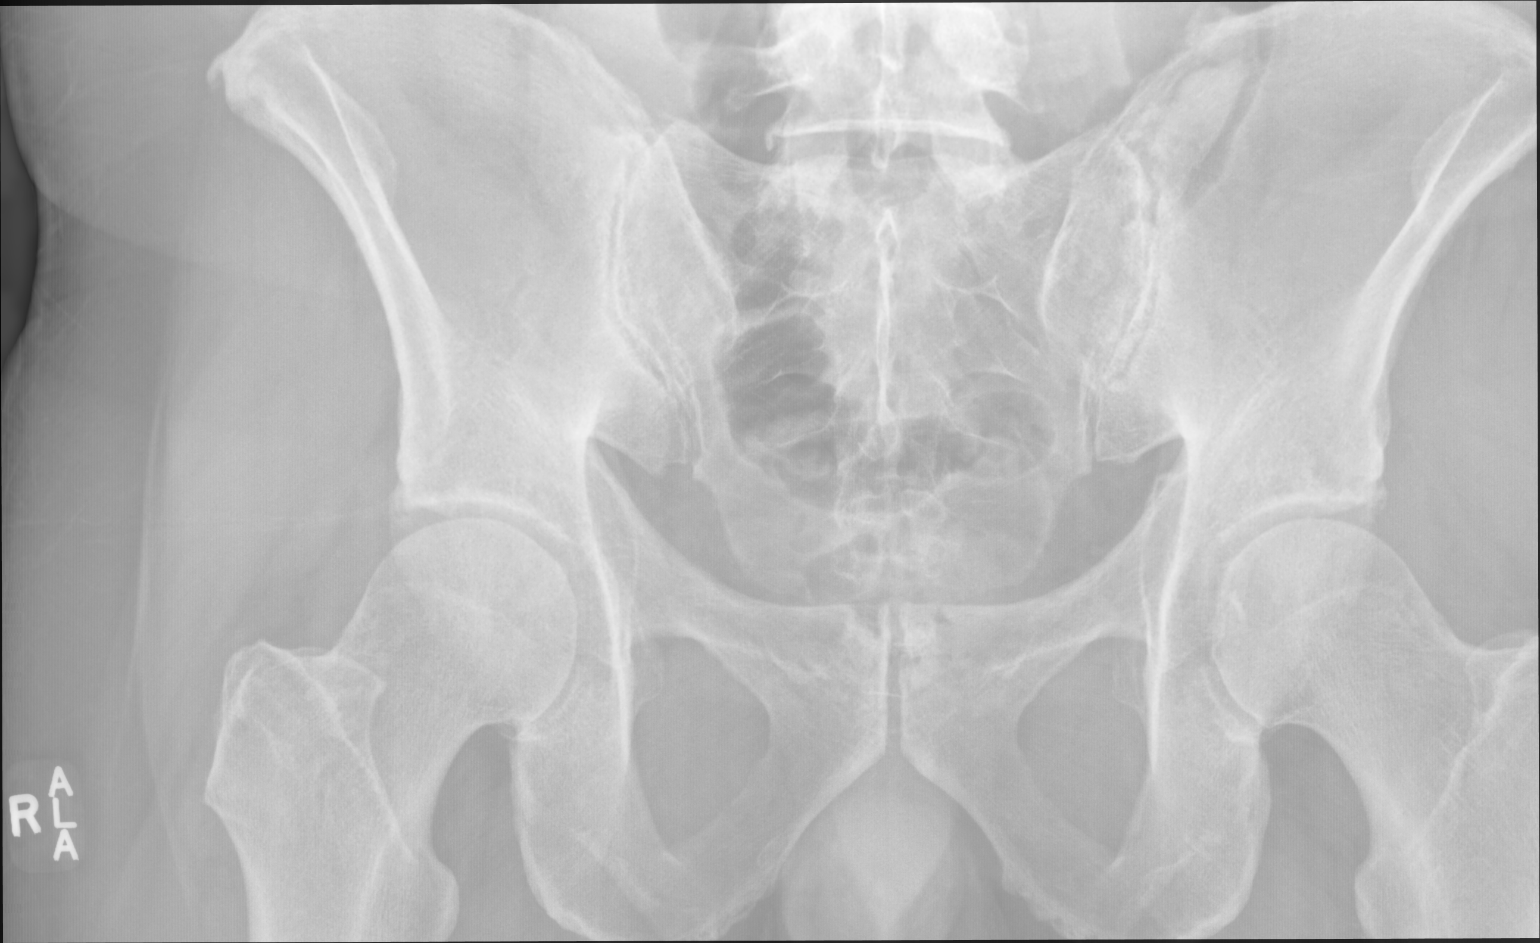

[coccyx ap]
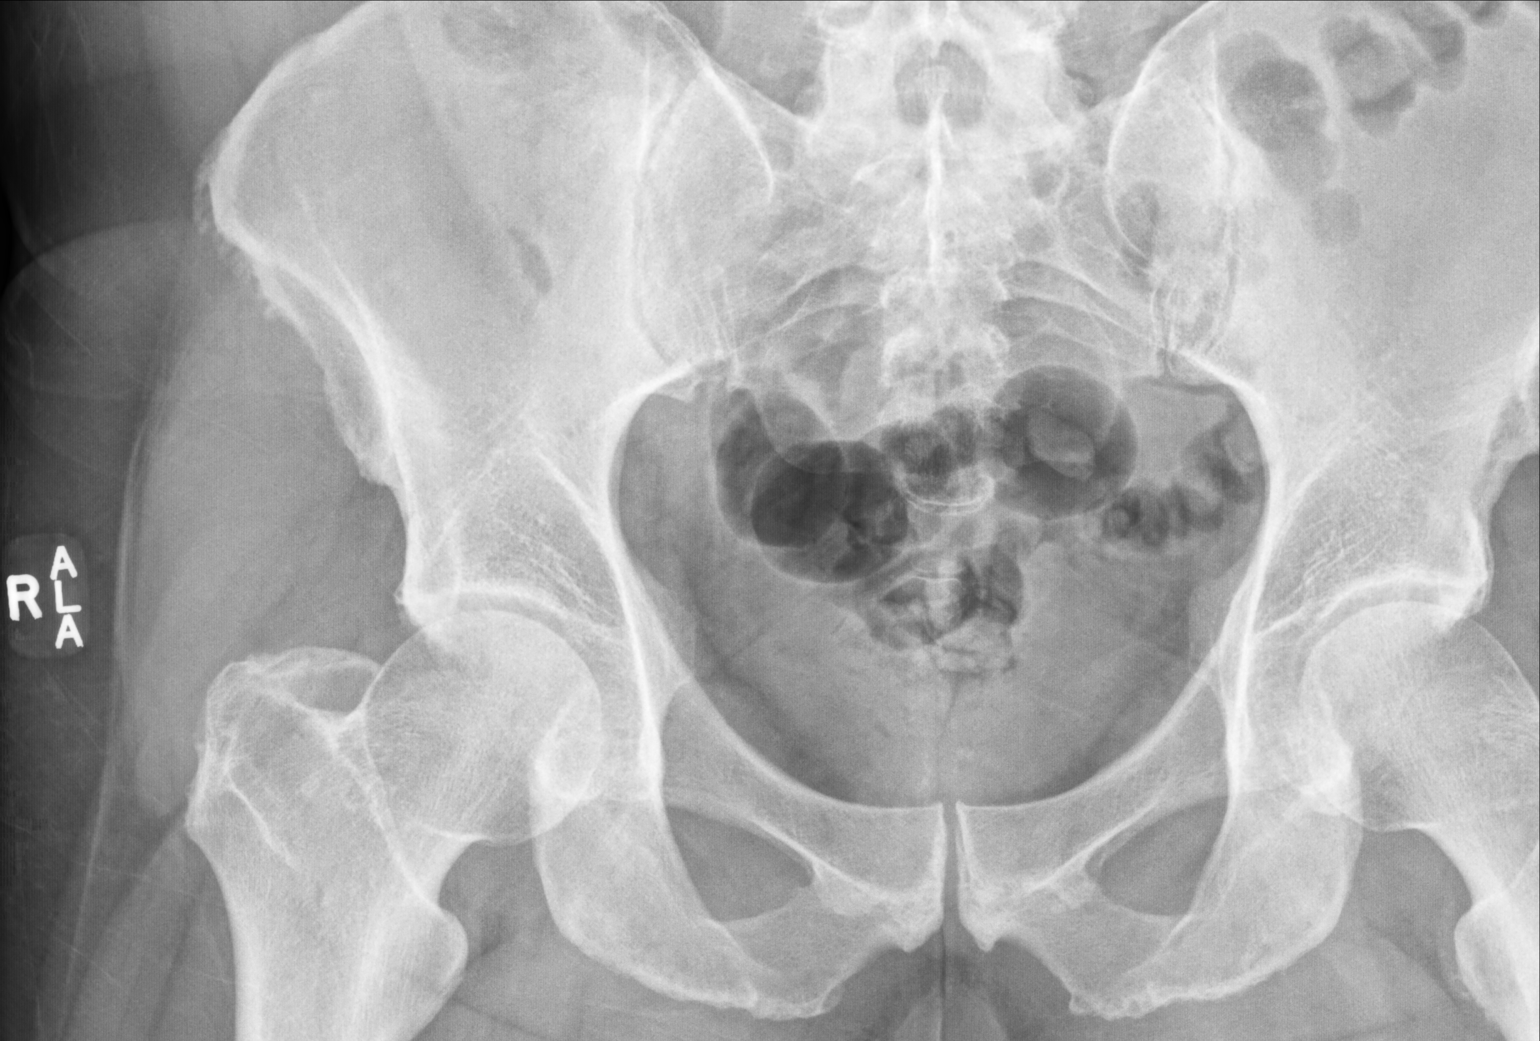

[sacrum/coccyx lat]
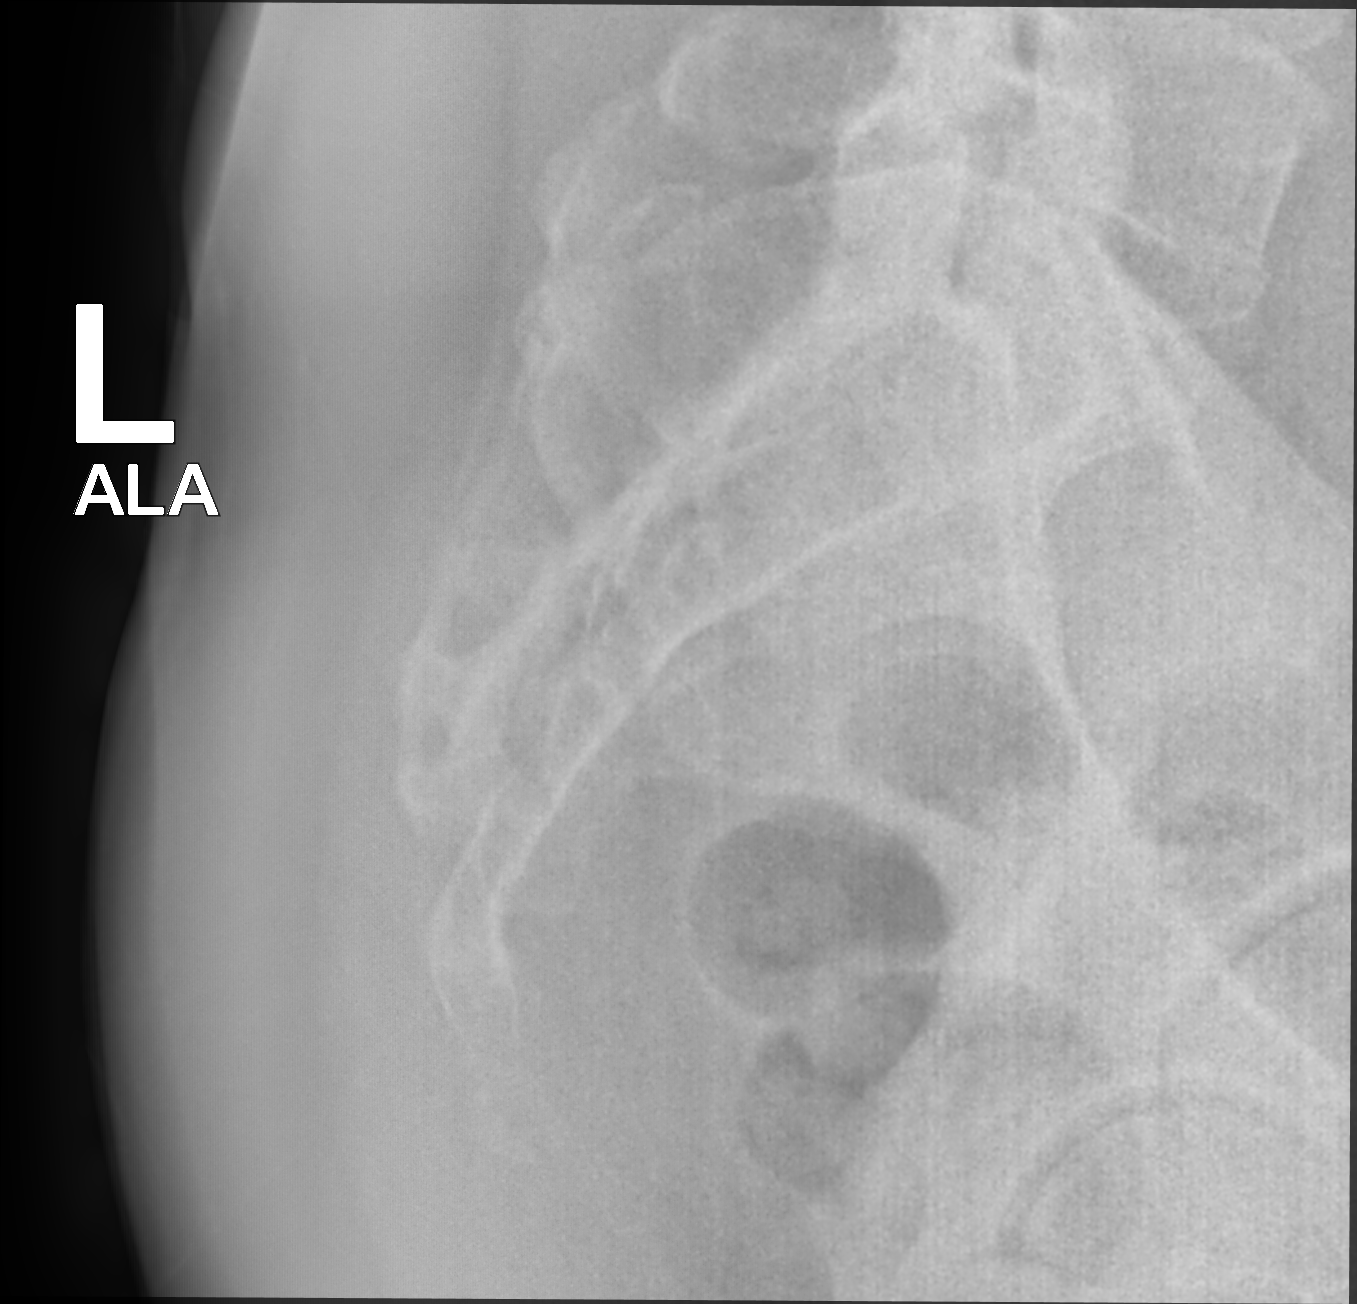

[3 of 3 positions shown; findings below may reference images not displayed]

FINDINGS: There is no evidence of fracture or other focal bone lesions.
IMPRESSION: Negative.

## 2022-12-09 ENCOUNTER — Ambulatory Visit: Payer: 59 | Admitting: Adult Health

## 2022-12-09 ENCOUNTER — Other Ambulatory Visit: Payer: Self-pay

## 2022-12-09 ENCOUNTER — Encounter: Payer: Self-pay | Admitting: Adult Health

## 2022-12-09 VITALS — BP 130/80 | HR 74 | Temp 98.3°F | Ht 69.5 in | Wt 221.0 lb

## 2022-12-09 DIAGNOSIS — E119 Type 2 diabetes mellitus without complications: Secondary | ICD-10-CM | POA: Diagnosis not present

## 2022-12-09 DIAGNOSIS — Z23 Encounter for immunization: Secondary | ICD-10-CM | POA: Diagnosis not present

## 2022-12-09 DIAGNOSIS — I10 Essential (primary) hypertension: Secondary | ICD-10-CM

## 2022-12-09 DIAGNOSIS — E7800 Pure hypercholesterolemia, unspecified: Secondary | ICD-10-CM | POA: Diagnosis not present

## 2022-12-09 DIAGNOSIS — F39 Unspecified mood [affective] disorder: Secondary | ICD-10-CM

## 2022-12-09 DIAGNOSIS — Z125 Encounter for screening for malignant neoplasm of prostate: Secondary | ICD-10-CM

## 2022-12-09 DIAGNOSIS — Z0000 Encounter for general adult medical examination without abnormal findings: Secondary | ICD-10-CM

## 2022-12-09 LAB — CBC
HCT: 43.1 % (ref 39.0–52.0)
Hemoglobin: 14.1 g/dL (ref 13.0–17.0)
MCHC: 32.8 g/dL (ref 30.0–36.0)
MCV: 87 fL (ref 78.0–100.0)
Platelets: 306 10*3/uL (ref 150.0–400.0)
RBC: 4.95 Mil/uL (ref 4.22–5.81)
RDW: 14.4 % (ref 11.5–15.5)
WBC: 7.4 10*3/uL (ref 4.0–10.5)

## 2022-12-09 LAB — LIPID PANEL
Cholesterol: 166 mg/dL (ref 0–200)
HDL: 40.7 mg/dL (ref >39.00)
LDL Cholesterol: 89 mg/dL (ref 0–99)
NonHDL: 124.98
Total CHOL/HDL Ratio: 4
Triglycerides: 178 mg/dL — ABNORMAL HIGH (ref 0.0–149.0)
VLDL: 35.6 mg/dL (ref 0.0–40.0)

## 2022-12-09 LAB — MICROALBUMIN / CREATININE URINE RATIO
Creatinine,U: 212.6 mg/dL
Microalb Creat Ratio: 0.5 mg/g (ref 0.0–30.0)
Microalb, Ur: 1.1 mg/dL (ref 0.0–1.9)

## 2022-12-09 LAB — COMPREHENSIVE METABOLIC PANEL
ALT: 23 U/L (ref 0–53)
AST: 14 U/L (ref 0–37)
Albumin: 4 g/dL (ref 3.5–5.2)
Alkaline Phosphatase: 87 U/L (ref 39–117)
BUN: 12 mg/dL (ref 6–23)
CO2: 29 meq/L (ref 19–32)
Calcium: 9.1 mg/dL (ref 8.4–10.5)
Chloride: 103 meq/L (ref 96–112)
Creatinine, Ser: 0.93 mg/dL (ref 0.40–1.50)
GFR: 89.12 mL/min (ref >60.00)
Glucose, Bld: 118 mg/dL — ABNORMAL HIGH (ref 70–99)
Potassium: 4 meq/L (ref 3.5–5.1)
Sodium: 138 meq/L (ref 135–145)
Total Bilirubin: 0.5 mg/dL (ref 0.2–1.2)
Total Protein: 6.5 g/dL (ref 6.0–8.3)

## 2022-12-09 LAB — PSA: PSA: 1.01 ng/mL (ref 0.10–4.00)

## 2022-12-09 LAB — TSH: TSH: 1.76 u[IU]/mL (ref 0.35–5.50)

## 2022-12-09 LAB — HEMOGLOBIN A1C: Hgb A1c MFr Bld: 6.5 % (ref 4.6–6.5)

## 2022-12-09 MED ORDER — ATORVASTATIN CALCIUM 80 MG PO TABS: 80.0000 mg | ORAL_TABLET | Freq: Every day | ORAL | 2 refills | Status: DC

## 2022-12-09 MED ORDER — FLUOXETINE HCL 40 MG PO CAPS: 40.0000 mg | ORAL_CAPSULE | Freq: Every day | ORAL | 3 refills | Status: DC

## 2022-12-09 NOTE — Progress Notes (Signed)
Subjective:    Patient ID: Willie Johnson, male    DOB: 07-25-1960, 62 y.o.   MRN: [id number]  HPI  Patient presents for yearly preventative medicine examination. He is a pleasant 62 year old male who  has a past medical history of Allergic rhinitis due to pollen, Diverticulosis, GERD (gastroesophageal reflux disease), HTN (hypertension), Hyperlipidemia, and Type 2 diabetes mellitus without complication, without long-term current use of insulin (HCC) (04/16/2018).  DM type 2 -diet controlled.  He does not check his blood sugars at home/ His diet has been poor and he has not been exercising much but tries to eat healthy.   Lab Results  Component Value Date   HGBA1C 6.4 (A) 05/14/2022   HTN - managed with losartan 100 mg daily and norvasc 2.5 mg daily.  He denies dizziness, lightheadedness, chest pain, or shortness of breath BP Readings from Last 3 Encounters:  12/09/22 130/80  05/14/22 130/86  09/30/21 122/80    Mood disorder -Takes Prozac 20 mg daily.  Feels as though this controls his symptoms well.  Hyperlipidemia-managed with Lipitor 40 mg daily.  He denies myalgia or fatigue. He has not experienced any chest pain or shortness of breath. He did have a CT Calcium Score done which showed a score of 41.6. This was 57th percentile.  Lab Results  Component Value Date   CHOL 177 07/15/2021   HDL 39.10 07/15/2021   LDLCALC 98 07/15/2021   LDLDIRECT 111.0 04/11/2018   TRIG 198.0 (H) 07/15/2021   CHOLHDL 5 07/15/2021   All immunizations and health maintenance protocols were reviewed with the patient and needed orders were placed.  Appropriate screening laboratory values were ordered for the patient including screening of hyperlipidemia, renal function and hepatic function. If indicated by BPH, a PSA was ordered.  Medication reconciliation,  past medical history, social history, problem list and allergies were reviewed in detail with the patient  Goals were established with regard  to weight loss, exercise, and  diet in compliance with medications.   Wt Readings from Last 3 Encounters:  12/09/22 221 lb (100.2 kg)  05/14/22 225 lb (102.1 kg)  09/30/21 218 lb 3.2 oz (99 kg)   He is up to date on routine colon cancer screening    Review of Systems  Constitutional: Negative.   HENT: Negative.    Eyes: Negative.   Respiratory: Negative.    Cardiovascular: Negative.   Gastrointestinal: Negative.   Endocrine: Negative.   Genitourinary: Negative.   Musculoskeletal: Negative.   Skin: Negative.   Allergic/Immunologic: Negative.   Neurological: Negative.   Hematological: Negative.   Psychiatric/Behavioral: Negative.    All other systems reviewed and are negative.  Past Medical History:  Diagnosis Date   Allergic rhinitis due to pollen    Diverticulosis    GERD (gastroesophageal reflux disease)    HTN (hypertension)    Hyperlipidemia    Type 2 diabetes mellitus without complication, without long-term current use of insulin (HCC) 04/16/2018    Social History   Socioeconomic History   Marital status: Married    Spouse name: Not on file   Number of children: Not on file   Years of education: Not on file   Highest education level: Bachelor's degree (e.g., BA, AB, BS)  Occupational History   Occupation: software  Tobacco Use   Smoking status: Former    Current packs/day: 0.00    Average packs/day: 0.5 packs/day for 20.0 years (10.0 ttl pk-yrs)    Types: Cigarettes  Start date: 07/01/1997    Quit date: 07/01/2017    Years since quitting: 5.4   Smokeless tobacco: Never   Tobacco comments:    no vapes   Vaping Use   Vaping status: Never Used  Substance and Sexual Activity   Alcohol use: No    Alcohol/week: 0.0 standard drinks of alcohol   Drug use: No   Sexual activity: Yes    Partners: Male  Other Topics Concern   Not on file  Social History Narrative   Works as a Engineer, structural for [employer]   Social Determinants of Health    Financial Resource Strain: Low Risk  (05/13/2022)   Overall Financial Resource Strain (CARDIA)    Difficulty of Paying Living Expenses: Not hard at all  Food Insecurity: No Food Insecurity (05/13/2022)   Hunger Vital Sign    Worried About Running Out of Food in the Last Year: Never true    Ran Out of Food in the Last Year: Never true  Transportation Needs: No Transportation Needs (05/13/2022)   PRAPARE - Administrator, Civil Service (Medical): No    Lack of Transportation (Non-Medical): No  Physical Activity: Unknown (05/13/2022)   Exercise Vital Sign    Days of Exercise per Week: Patient declined    Minutes of Exercise per Session: Not on file  Stress: No Stress Concern Present (05/13/2022)   [employer] of Occupational Health - Occupational Stress Questionnaire    Feeling of Stress : Only a little  Social Connections: Moderately Isolated (05/13/2022)   Social Connection and Isolation Panel [NHANES]    Frequency of Communication with Friends and Family: Twice a week    Frequency of Social Gatherings with Friends and Family: Twice a week    Attends Religious Services: Never    Database administrator or Organizations: No    Attends Engineer, structural: Not on file    Marital Status: Married  Catering manager Violence: Not on file    Past Surgical History:  Procedure Laterality Date   KNEE SURGERY Left 03/2001   lgament tear   SCAPHOID FRACTURE SURGERY Right 03/2006    Family History  Problem Relation Age of Onset   Arthritis Mother    Hyperlipidemia Mother    Heart disease Mother        Valve problem and CHF   Hypertension Mother    Diabetes Mother    Hyperlipidemia Father    Heart disease Father    Stroke Father        CVA    Pulmonary embolism Sister    Colon cancer Neg Hx    Pancreatic cancer Neg Hx    Stomach cancer Neg Hx    Colon polyps Neg Hx    Rectal cancer Neg Hx     No Known Allergies  Current Outpatient Medications on File Prior  to Visit  Medication Sig Dispense Refill   amLODipine (NORVASC) 2.5 MG tablet TAKE 1 TABLET BY MOUTH EVERY DAY 90 tablet 3   atorvastatin (LIPITOR) 40 MG tablet TAKE 1 TABLET DAILY AT 6PM 90 tablet 3   FLUoxetine (PROZAC) 20 MG capsule TAKE 1 CAPSULE BY MOUTH EVERY DAY 90 capsule 1   losartan (COZAAR) 100 MG tablet TAKE 1 TABLET BY MOUTH EVERY DAY 90 tablet 3   Multiple Vitamin (MULTIVITAMIN) tablet Take 1 tablet by mouth daily.     No current facility-administered medications on file prior to visit.    BP 130/80  Pulse 74   Temp 98.3 F (36.8 C) (Oral)   Ht 5' 9.5" (1.765 m)   Wt 221 lb (100.2 kg)   SpO2 96%   BMI 32.17 kg/m       Objective:   Physical Exam Vitals and nursing note reviewed.  Constitutional:      General: He is not in acute distress.    Appearance: Normal appearance. He is obese. He is not ill-appearing.  HENT:     Head: Normocephalic and atraumatic.     Right Ear: Tympanic membrane, ear canal and external ear normal. There is no impacted cerumen.     Left Ear: Tympanic membrane, ear canal and external ear normal. There is no impacted cerumen.     Nose: Nose normal. No congestion or rhinorrhea.     Mouth/Throat:     Mouth: Mucous membranes are moist.     Pharynx: Oropharynx is clear.  Eyes:     Extraocular Movements: Extraocular movements intact.     Conjunctiva/sclera: Conjunctivae normal.     Pupils: Pupils are equal, round, and reactive to light.  Neck:     Vascular: No carotid bruit.  Cardiovascular:     Rate and Rhythm: Normal rate and regular rhythm.     Pulses: Normal pulses.     Heart sounds: No murmur heard.    No friction rub. No gallop.  Pulmonary:     Effort: Pulmonary effort is normal.     Breath sounds: Normal breath sounds.  Abdominal:     General: Abdomen is flat. Bowel sounds are normal. There is no distension.     Palpations: Abdomen is soft. There is no mass.     Tenderness: There is no abdominal tenderness. There is no  guarding or rebound.     Hernia: No hernia is present.  Musculoskeletal:        General: Normal range of motion.     Cervical back: Normal range of motion and neck supple.  Lymphadenopathy:     Cervical: No cervical adenopathy.  Skin:    General: Skin is warm and dry.     Capillary Refill: Capillary refill takes less than 2 seconds.  Neurological:     General: No focal deficit present.     Mental Status: He is alert and oriented to person, place, and time.  Psychiatric:        Mood and Affect: Mood normal.        Behavior: Behavior normal.        Thought Content: Thought content normal.        Judgment: Judgment normal.       Assessment & Plan:  1. Routine general medical examination at a health care facility Today patient counseled on age appropriate routine health concerns for screening and prevention, each reviewed and up to date or declined. Immunizations reviewed and up to date or declined. Labs ordered and reviewed. Risk factors for depression reviewed and negative. Hearing function and visual acuity are intact. ADLs screened and addressed as needed. Functional ability and level of safety reviewed and appropriate. Education, counseling and referrals performed based on assessed risks today. Patient provided with a copy of personalized plan for preventive services. - Needs to start exercising and eating healthy  - Follow up in one year or sooner if needed  2. Type 2 diabetes mellitus without complication, without long-term current use of insulin (HCC) - Consider adding metformin  - Encouraged diet and exercise  - Lipid panel; Future - TSH;  Future - CBC; Future - Comprehensive metabolic panel; Future - Hemoglobin A1c; Future - Microalbumin/Creatinine Ratio, Urine; Future  3. Essential hypertension - Well controlled.  - Lipid panel; Future - TSH; Future - CBC; Future - Comprehensive metabolic panel; Future  4. Pure hypercholesterolemia - Consider increase in statin  -  Lipid panel; Future - TSH; Future - CBC; Future - Comprehensive metabolic panel; Future  5. Mood disorder (HCC) - Will increase Prozac to 40 mg  - Follow up if does not feel controlled.  - FLUoxetine (PROZAC) 40 MG capsule; Take 1 capsule (40 mg total) by mouth daily.  Dispense: 90 capsule; Refill: 3  6. Prostate cancer screening  - PSA; Future  7. Need for influenza vaccination  - Flu vaccine trivalent PF, 6mos and older(Flulaval,Afluria,Fluarix,Fluzone)  Shirline Frees, NP

## 2022-12-25 ENCOUNTER — Other Ambulatory Visit: Payer: Self-pay | Admitting: Adult Health

## 2022-12-27 NOTE — Telephone Encounter (Signed)
  The original prescription was discontinued on 12/09/2022 by Shirline Frees, NP. Renewing this prescription may not be appropriate.

## 2023-03-29 ENCOUNTER — Other Ambulatory Visit: Payer: Self-pay | Admitting: Adult Health

## 2023-06-22 ENCOUNTER — Other Ambulatory Visit: Payer: Self-pay | Admitting: Adult Health

## 2023-07-20 ENCOUNTER — Other Ambulatory Visit: Payer: Self-pay | Admitting: Adult Health

## 2023-07-20 DIAGNOSIS — E7800 Pure hypercholesterolemia, unspecified: Secondary | ICD-10-CM

## 2023-07-27 ENCOUNTER — Ambulatory Visit: Admitting: Adult Health

## 2023-07-27 VITALS — BP 160/120 | HR 76 | Temp 98.2°F | Ht 69.5 in | Wt 227.0 lb

## 2023-07-27 DIAGNOSIS — I10 Essential (primary) hypertension: Secondary | ICD-10-CM | POA: Diagnosis not present

## 2023-07-27 DIAGNOSIS — F39 Unspecified mood [affective] disorder: Secondary | ICD-10-CM

## 2023-07-27 DIAGNOSIS — E119 Type 2 diabetes mellitus without complications: Secondary | ICD-10-CM | POA: Diagnosis not present

## 2023-07-27 LAB — POCT GLYCOSYLATED HEMOGLOBIN (HGB A1C): Hemoglobin A1C: 6.4 % — AB (ref 4.0–5.6)

## 2023-07-27 MED ORDER — FLUOXETINE HCL 10 MG PO TABS: 10.0000 mg | ORAL_TABLET | Freq: Every day | ORAL | 0 refills | Status: DC

## 2023-07-27 MED ORDER — FLUOXETINE HCL 20 MG PO TABS: 20.0000 mg | ORAL_TABLET | Freq: Every day | ORAL | 0 refills | Status: DC

## 2023-07-27 NOTE — Progress Notes (Signed)
 Subjective:    Patient ID: Willie Johnson, male    DOB: January 19, 1961, 63 y.o.   MRN: [id number]  HPI 63 year old male who  has a past medical history of Allergic rhinitis due to pollen, Diverticulosis, GERD (gastroesophageal reflux disease), HTN (hypertension), Hyperlipidemia, and Type 2 diabetes mellitus without complication, without long-term current use of insulin (HCC) (04/16/2018).  He presents to the office today for follow up regarding DM and HTN  DM type 2 -diet controlled.  He does not check his blood sugars at home/ His diet has been poor and he has not been exercising.   Lab Results  Component Value Date   HGBA1C 6.4 (A) 07/27/2023   HGBA1C 6.5 12/09/2022   HGBA1C 6.4 (A) 05/14/2022   HTN - managed with losartan  100 mg daily and norvasc  2.5 mg daily.  He denies dizziness, lightheadedness, chest pain, or shortness of breath. He has not taken his medication this morning.  BP Readings from Last 3 Encounters:  07/27/23 (!) 160/120  12/09/22 130/80  05/14/22 130/86   Mood Disorder - reports that since increasing Prozac  back in October from 20 mg to 40 mg he has had increased drowsiness and hunger. He feels better overall on the increased dose but would like to decrease to 30 mg daily.   Review of Systems  See HPI   Past Medical History:  Diagnosis Date   Allergic rhinitis due to pollen    Diverticulosis    GERD (gastroesophageal reflux disease)    HTN (hypertension)    Hyperlipidemia    Type 2 diabetes mellitus without complication, without long-term current use of insulin (HCC) 04/16/2018    Social History   Socioeconomic History   Marital status: Married    Spouse name: Not on file   Number of children: Not on file   Years of education: Not on file   Highest education level: Bachelor's degree (e.g., BA, AB, BS)  Occupational History   Occupation: software  Tobacco Use   Smoking status: Former    Current packs/day: 0.00    Average packs/day: 0.5 packs/day for  20.0 years (10.0 ttl pk-yrs)    Types: Cigarettes    Start date: 07/01/1997    Quit date: 07/01/2017    Years since quitting: 6.0   Smokeless tobacco: Never   Tobacco comments:    no vapes   Vaping Use   Vaping status: Never Used  Substance and Sexual Activity   Alcohol use: No    Alcohol/week: 0.0 standard drinks of alcohol   Drug use: No   Sexual activity: Yes    Partners: Male  Other Topics Concern   Not on file  Social History Narrative   Works as a Engineer, structural for [employer]   Social Drivers of Corporate investment banker Strain: Low Risk  (05/13/2022)   Overall Financial Resource Strain (CARDIA)    Difficulty of Paying Living Expenses: Not hard at all  Food Insecurity: No Food Insecurity (05/13/2022)   Hunger Vital Sign    Worried About Running Out of Food in the Last Year: Never true    Ran Out of Food in the Last Year: Never true  Transportation Needs: No Transportation Needs (05/13/2022)   PRAPARE - Administrator, Civil Service (Medical): No    Lack of Transportation (Non-Medical): No  Physical Activity: Unknown (05/13/2022)   Exercise Vital Sign    Days of Exercise per Week: Patient declined    Minutes of  Exercise per Session: Not on file  Stress: No Stress Concern Present (05/13/2022)   [employer] of Occupational Health - Occupational Stress Questionnaire    Feeling of Stress : Only a little  Social Connections: Moderately Isolated (05/13/2022)   Social Connection and Isolation Panel [NHANES]    Frequency of Communication with Friends and Family: Twice a week    Frequency of Social Gatherings with Friends and Family: Twice a week    Attends Religious Services: Never    Database administrator or Organizations: No    Attends Engineer, structural: Not on file    Marital Status: Married  Catering manager Violence: Not on file    Past Surgical History:  Procedure Laterality Date   KNEE SURGERY Left 03/2001   lgament tear    SCAPHOID FRACTURE SURGERY Right 03/2006    Family History  Problem Relation Age of Onset   Arthritis Mother    Hyperlipidemia Mother    Heart disease Mother        Valve problem and CHF   Hypertension Mother    Diabetes Mother    Hyperlipidemia Father    Heart disease Father    Stroke Father        CVA    Pulmonary embolism Sister    Colon cancer Neg Hx    Pancreatic cancer Neg Hx    Stomach cancer Neg Hx    Colon polyps Neg Hx    Rectal cancer Neg Hx     No Known Allergies  Current Outpatient Medications on File Prior to Visit  Medication Sig Dispense Refill   amLODipine  (NORVASC ) 2.5 MG tablet TAKE 1 TABLET BY MOUTH EVERY DAY 90 tablet 3   atorvastatin  (LIPITOR) 80 MG tablet TAKE 1 TABLET BY MOUTH EVERY DAY 90 tablet 2   FLUoxetine  (PROZAC ) 40 MG capsule Take 1 capsule (40 mg total) by mouth daily. 90 capsule 3   losartan  (COZAAR ) 100 MG tablet TAKE 1 TABLET BY MOUTH EVERY DAY 90 tablet 3   Multiple Vitamin (MULTIVITAMIN) tablet Take 1 tablet by mouth daily.     No current facility-administered medications on file prior to visit.    BP (!) 160/120   Pulse 76   Temp 98.2 F (36.8 C) (Oral)   Ht 5' 9.5" (1.765 m)   Wt 227 lb (103 kg)   SpO2 95%   BMI 33.04 kg/m       Objective:   Physical Exam Vitals and nursing note reviewed.  Constitutional:      Appearance: Normal appearance.  Cardiovascular:     Rate and Rhythm: Normal rate and regular rhythm.     Pulses: Normal pulses.     Heart sounds: Normal heart sounds.  Pulmonary:     Effort: Pulmonary effort is normal.     Breath sounds: Normal breath sounds.  Musculoskeletal:        General: Normal range of motion.  Skin:    General: Skin is warm and dry.  Neurological:     General: No focal deficit present.     Mental Status: He is alert and oriented to person, place, and time.  Psychiatric:        Mood and Affect: Mood normal.        Behavior: Behavior normal.        Thought Content: Thought content  normal.        Judgment: Judgment normal.       Assessment & Plan:  1. Diet-controlled diabetes  mellitus (HCC) (Primary)  - POC HgB A1c- 6.4 - Encouraged diet and exercise - Follow up in 6 months for CPE   2. Essential hypertension - Not at goal today - due to not taking medication  - Check BP at home   3. Mood disorder (HCC)  - FLUoxetine  (PROZAC ) 20 MG tablet; Take 1 tablet (20 mg total) by mouth daily. Take with 10 mg dose  Dispense: 90 tablet; Refill: 0 - FLUoxetine  (PROZAC ) 10 MG tablet; Take 1 tablet (10 mg total) by mouth daily. Take with 20 mg dose  Dispense: 90 tablet; Refill: 0 - He will send me a mychart message to let me know how he is doing in the next month   Alto Atta, NP

## 2023-08-13 ENCOUNTER — Other Ambulatory Visit: Payer: Self-pay | Admitting: Adult Health

## 2023-08-13 DIAGNOSIS — I10 Essential (primary) hypertension: Secondary | ICD-10-CM

## 2023-09-21 ENCOUNTER — Other Ambulatory Visit: Payer: Self-pay | Admitting: Adult Health

## 2023-09-21 DIAGNOSIS — F39 Unspecified mood [affective] disorder: Secondary | ICD-10-CM

## 2023-09-21 NOTE — Telephone Encounter (Signed)
  The original prescription was discontinued on 07/27/2023 by Merna Huxley, NP. Renewing this prescription may not be appropriate.

## 2023-09-28 ENCOUNTER — Encounter: Payer: Self-pay | Admitting: Adult Health

## 2023-09-29 ENCOUNTER — Other Ambulatory Visit: Payer: Self-pay | Admitting: Adult Health

## 2023-09-29 MED ORDER — FLUOXETINE HCL 40 MG PO CAPS: 40.0000 mg | ORAL_CAPSULE | Freq: Every day | ORAL | 3 refills | Status: AC

## 2023-09-29 NOTE — Telephone Encounter (Signed)
**Note De-identified  [place] Obfuscation** Please advise 

## 2023-10-17 ENCOUNTER — Encounter: Payer: Self-pay | Admitting: Adult Health

## 2023-10-17 DIAGNOSIS — I10 Essential (primary) hypertension: Secondary | ICD-10-CM

## 2023-10-18 MED ORDER — LOSARTAN POTASSIUM 100 MG PO TABS: 100.0000 mg | ORAL_TABLET | Freq: Every day | ORAL | 0 refills | Status: DC

## 2023-10-18 MED ORDER — ATORVASTATIN CALCIUM 80 MG PO TABS: 80.0000 mg | ORAL_TABLET | Freq: Every day | ORAL | 2 refills | Status: DC

## 2023-10-18 MED ORDER — AMLODIPINE BESYLATE 2.5 MG PO TABS: 2.5000 mg | ORAL_TABLET | Freq: Every day | ORAL | 0 refills | Status: DC

## 2023-12-23 ENCOUNTER — Encounter: Payer: Self-pay | Admitting: Adult Health

## 2023-12-23 ENCOUNTER — Ambulatory Visit (INDEPENDENT_AMBULATORY_CARE_PROVIDER_SITE_OTHER): Admitting: Adult Health

## 2023-12-23 VITALS — BP 122/80 | HR 74 | Temp 98.1°F | Ht 69.25 in | Wt 233.0 lb

## 2023-12-23 DIAGNOSIS — E119 Type 2 diabetes mellitus without complications: Secondary | ICD-10-CM

## 2023-12-23 DIAGNOSIS — Z0000 Encounter for general adult medical examination without abnormal findings: Secondary | ICD-10-CM | POA: Diagnosis not present

## 2023-12-23 DIAGNOSIS — Z23 Encounter for immunization: Secondary | ICD-10-CM | POA: Diagnosis not present

## 2023-12-23 DIAGNOSIS — I10 Essential (primary) hypertension: Secondary | ICD-10-CM | POA: Diagnosis not present

## 2023-12-23 DIAGNOSIS — E7800 Pure hypercholesterolemia, unspecified: Secondary | ICD-10-CM | POA: Diagnosis not present

## 2023-12-23 DIAGNOSIS — Z1211 Encounter for screening for malignant neoplasm of colon: Secondary | ICD-10-CM

## 2023-12-23 DIAGNOSIS — Z125 Encounter for screening for malignant neoplasm of prostate: Secondary | ICD-10-CM

## 2023-12-23 DIAGNOSIS — F39 Unspecified mood [affective] disorder: Secondary | ICD-10-CM

## 2023-12-23 LAB — COMPREHENSIVE METABOLIC PANEL WITH GFR
ALT: 33 U/L (ref 0–53)
AST: 16 U/L (ref 0–37)
Albumin: 4.3 g/dL (ref 3.5–5.2)
Alkaline Phosphatase: 94 U/L (ref 39–117)
BUN: 13 mg/dL (ref 6–23)
CO2: 27 meq/L (ref 19–32)
Calcium: 9.4 mg/dL (ref 8.4–10.5)
Chloride: 101 meq/L (ref 96–112)
Creatinine, Ser: 0.87 mg/dL (ref 0.40–1.50)
GFR: 92.97 mL/min (ref >60.00)
Glucose, Bld: 112 mg/dL — ABNORMAL HIGH (ref 70–99)
Potassium: 4.3 meq/L (ref 3.5–5.1)
Sodium: 136 meq/L (ref 135–145)
Total Bilirubin: 0.5 mg/dL (ref 0.2–1.2)
Total Protein: 6.9 g/dL (ref 6.0–8.3)

## 2023-12-23 LAB — MICROALBUMIN / CREATININE URINE RATIO
Creatinine,U: 55.2 mg/dL
Microalb Creat Ratio: UNDETERMINED mg/g (ref 0.0–30.0)
Microalb, Ur: <0.7 mg/dL

## 2023-12-23 LAB — LIPID PANEL
Cholesterol: 160 mg/dL (ref 0–200)
HDL: 39.3 mg/dL (ref >39.00)
LDL Cholesterol: 87 mg/dL (ref 0–99)
NonHDL: 120.81
Total CHOL/HDL Ratio: 4
Triglycerides: 170 mg/dL — ABNORMAL HIGH (ref 0.0–149.0)
VLDL: 34 mg/dL (ref 0.0–40.0)

## 2023-12-23 LAB — CBC
HCT: 40.6 % (ref 39.0–52.0)
Hemoglobin: 13.8 g/dL (ref 13.0–17.0)
MCHC: 34.1 g/dL (ref 30.0–36.0)
MCV: 85.3 fl (ref 78.0–100.0)
Platelets: 304 K/uL (ref 150.0–400.0)
RBC: 4.76 Mil/uL (ref 4.22–5.81)
RDW: 14.8 % (ref 11.5–15.5)
WBC: 7.5 K/uL (ref 4.0–10.5)

## 2023-12-23 LAB — TSH: TSH: 1.68 u[IU]/mL (ref 0.35–5.50)

## 2023-12-23 LAB — PSA: PSA: 0.7 ng/mL (ref 0.10–4.00)

## 2023-12-23 LAB — HEMOGLOBIN A1C: Hgb A1c MFr Bld: 7.3 % — ABNORMAL HIGH (ref 4.6–6.5)

## 2023-12-23 NOTE — Progress Notes (Signed)
 Subjective:    Patient ID: Willie Johnson, male    DOB: 02-Jul-1960, 63 y.o.   MRN: [id number]  HPI Patient presents for yearly preventative medicine examination. He is a pleasant 63 year old male who  has a past medical history of Allergic rhinitis due to pollen, Diverticulosis, GERD (gastroesophageal reflux disease), HTN (hypertension), Hyperlipidemia, and Type 2 diabetes mellitus without complication, without long-term current use of insulin (HCC) (04/16/2018).  DM type 2 -diet controlled.  He does not check his blood sugars at home/ His diet has been poor and he has not been exercising.  Lab Results  Component Value Date   HGBA1C 6.4 (A) 07/27/2023   HGBA1C 6.5 12/09/2022   HGBA1C 6.4 (A) 05/14/2022    HTN - managed with losartan  100 mg daily and norvasc  2.5 mg daily.  He denies dizziness, lightheadedness, chest pain, or shortness of breath. He has not taken his medication this morning.   Wt Readings from Last 3 Encounters:  12/23/23 233 lb (105.7 kg)  07/27/23 227 lb (103 kg)  12/09/22 221 lb (100.2 kg)   Mood disorder -Takes Prozac  40 mg daily.  Feels as though this controls his symptoms well.  Hyperlipidemia-managed with Lipitor 80 mg daily.  He denies myalgia or fatigue. He has not experienced any chest pain or shortness of breath. He did have a CT Calcium  Score done in 08/2021 which showed a score of 41.6. This was 57th percentile.  Lab Results  Component Value Date   CHOL 166 12/09/2022   HDL 40.70 12/09/2022   LDLCALC 89 12/09/2022   LDLDIRECT 111.0 04/11/2018   TRIG 178.0 (H) 12/09/2022   CHOLHDL 4 12/09/2022   All immunizations and health maintenance protocols were reviewed with the patient and needed orders were placed.   Appropriate screening laboratory values were ordered for the patient including screening of hyperlipidemia, renal function and hepatic function. If indicated by BPH, a PSA was ordered.  Medication reconciliation,  past medical history, social  history, problem list and allergies were reviewed in detail with the patient  Goals were established with regard to weight loss, exercise, and  diet in compliance with medications. He has not been eating healthy or exercising.  Wt Readings from Last 3 Encounters:  12/23/23 233 lb (105.7 kg)  07/27/23 227 lb (103 kg)  12/09/22 221 lb (100.2 kg)   He is overdue for colon cancer screening   Review of Systems  Constitutional: Negative.   HENT: Negative.    Eyes: Negative.   Respiratory: Negative.    Cardiovascular: Negative.   Gastrointestinal: Negative.   Endocrine: Negative.   Genitourinary: Negative.   Musculoskeletal: Negative.   Skin: Negative.   Allergic/Immunologic: Negative.   Neurological: Negative.   Hematological: Negative.   Psychiatric/Behavioral: Negative.    All other systems reviewed and are negative.  Past Medical History:  Diagnosis Date   Allergic rhinitis due to pollen    Diverticulosis    GERD (gastroesophageal reflux disease)    HTN (hypertension)    Hyperlipidemia    Type 2 diabetes mellitus without complication, without long-term current use of insulin (HCC) 04/16/2018    Social History   Socioeconomic History   Marital status: Married    Spouse name: Not on file   Number of children: Not on file   Years of education: Not on file   Highest education level: Bachelor's degree (e.g., BA, AB, BS)  Occupational History   Occupation: software  Tobacco Use   Smoking status: Former  Current packs/day: 0.00    Average packs/day: 0.5 packs/day for 20.0 years (10.0 ttl pk-yrs)    Types: Cigarettes    Start date: 07/01/1997    Quit date: 07/01/2017    Years since quitting: 6.4   Smokeless tobacco: Never   Tobacco comments:    no vapes   Vaping Use   Vaping status: Never Used  Substance and Sexual Activity   Alcohol use: No    Alcohol/week: 0.0 standard drinks of alcohol   Drug use: No   Sexual activity: Yes    Partners: Male  Other Topics  Concern   Not on file  Social History Narrative   Works as a Engineer, structural for [employer]   Social Drivers of Corporate investment banker Strain: Low Risk  (12/19/2023)   Overall Financial Resource Strain (CARDIA)    Difficulty of Paying Living Expenses: Not hard at all  Food Insecurity: No Food Insecurity (12/19/2023)   Hunger Vital Sign    Worried About Running Out of Food in the Last Year: Never true    Ran Out of Food in the Last Year: Never true  Transportation Needs: No Transportation Needs (12/19/2023)   PRAPARE - Administrator, Civil Service (Medical): No    Lack of Transportation (Non-Medical): No  Physical Activity: Inactive (12/19/2023)   Exercise Vital Sign    Days of Exercise per Week: 0 days    Minutes of Exercise per Session: Not on file  Stress: Stress Concern Present (12/19/2023)   [employer] of Occupational Health - Occupational Stress Questionnaire    Feeling of Stress: To some extent  Social Connections: Moderately Isolated (12/19/2023)   Social Connection and Isolation Panel    Frequency of Communication with Friends and Family: Once a week    Frequency of Social Gatherings with Friends and Family: Twice a week    Attends Religious Services: Never    Database administrator or Organizations: No    Attends Engineer, structural: Not on file    Marital Status: Married  Catering manager Violence: Not on file    Past Surgical History:  Procedure Laterality Date   KNEE SURGERY Left 03/2001   lgament tear   SCAPHOID FRACTURE SURGERY Right 03/2006    Family History  Problem Relation Age of Onset   Arthritis Mother    Hyperlipidemia Mother    Heart disease Mother        Valve problem and CHF   Hypertension Mother    Diabetes Mother    Hyperlipidemia Father    Heart disease Father    Stroke Father        CVA    Pulmonary embolism Sister    Colon cancer Neg Hx    Pancreatic cancer Neg Hx    Stomach cancer Neg  Hx    Colon polyps Neg Hx    Rectal cancer Neg Hx     No Known Allergies  Current Outpatient Medications on File Prior to Visit  Medication Sig Dispense Refill   amLODipine  (NORVASC ) 2.5 MG tablet Take 1 tablet (2.5 mg total) by mouth daily. 90 tablet 0   atorvastatin  (LIPITOR) 80 MG tablet Take 1 tablet (80 mg total) by mouth daily. 90 tablet 2   FLUoxetine  (PROZAC ) 40 MG capsule Take 1 capsule (40 mg total) by mouth daily. 90 capsule 3   losartan  (COZAAR ) 100 MG tablet Take 1 tablet (100 mg total) by mouth daily. 90 tablet 0  Multiple Vitamin (MULTIVITAMIN) tablet Take 1 tablet by mouth daily.     No current facility-administered medications on file prior to visit.    BP 122/80   Pulse 74   Temp 98.1 F (36.7 C) (Oral)   Ht 5' 9.25 (1.759 m)   Wt 233 lb (105.7 kg)   SpO2 96%   BMI 34.16 kg/m       Objective:   Physical Exam Vitals and nursing note reviewed.  Constitutional:      General: He is not in acute distress.    Appearance: Normal appearance. He is obese. He is not ill-appearing.  HENT:     Head: Normocephalic and atraumatic.     Right Ear: Tympanic membrane, ear canal and external ear normal. There is no impacted cerumen.     Left Ear: Tympanic membrane, ear canal and external ear normal. There is no impacted cerumen.     Nose: Nose normal. No congestion or rhinorrhea.     Mouth/Throat:     Mouth: Mucous membranes are moist.     Pharynx: Oropharynx is clear.  Eyes:     Extraocular Movements: Extraocular movements intact.     Conjunctiva/sclera: Conjunctivae normal.     Pupils: Pupils are equal, round, and reactive to light.  Neck:     Vascular: No carotid bruit.  Cardiovascular:     Rate and Rhythm: Normal rate and regular rhythm.     Pulses: Normal pulses.     Heart sounds: No murmur heard.    No friction rub. No gallop.  Pulmonary:     Effort: Pulmonary effort is normal.     Breath sounds: Normal breath sounds.  Abdominal:     General: Abdomen  is flat. Bowel sounds are normal. There is no distension.     Palpations: Abdomen is soft. There is no mass.     Tenderness: There is no abdominal tenderness. There is no guarding or rebound.     Hernia: No hernia is present.  Musculoskeletal:        General: Normal range of motion.     Cervical back: Normal range of motion and neck supple.  Lymphadenopathy:     Cervical: No cervical adenopathy.  Skin:    General: Skin is warm and dry.     Capillary Refill: Capillary refill takes less than 2 seconds.  Neurological:     General: No focal deficit present.     Mental Status: He is alert and oriented to person, place, and time.  Psychiatric:        Mood and Affect: Mood normal.        Behavior: Behavior normal.        Thought Content: Thought content normal.        Judgment: Judgment normal.       Assessment & Plan:  1. Routine general medical examination at a health care facility (Primary) Today patient counseled on age appropriate routine health concerns for screening and prevention, each reviewed and up to date or declined. Immunizations reviewed and up to date or declined. Labs ordered and reviewed. Risk factors for depression reviewed and negative. Hearing function and visual acuity are intact. ADLs screened and addressed as needed. Functional ability and level of safety reviewed and appropriate. Education, counseling and referrals performed based on assessed risks today. Patient provided with a copy of personalized plan for preventive services. - Encouraged to work on weight loss through diet and exercise - Follow up in 1 year or sooner if  needed   2. Diet-controlled diabetes mellitus (HCC) - Consider adding agent  - Follow up in 3-6 months depending on A1c  - Lipid panel; Future - TSH; Future - CBC; Future - Comprehensive metabolic panel with GFR; Future - Hemoglobin A1c; Future - Microalbumin/Creatinine Ratio, Urine; Future  3. Essential hypertension - Well controlled. No  change in medication  - Lipid panel; Future - TSH; Future - CBC; Future - Comprehensive metabolic panel with GFR; Future  4. Mood disorder - Continue with SSRI - Lipid panel; Future - TSH; Future - CBC; Future - Comprehensive metabolic panel with GFR; Future  5. Pure hypercholesterolemia - Consider adding Zetia  - Lipid panel; Future - TSH; Future - CBC; Future - Comprehensive metabolic panel with GFR; Future  6. Prostate cancer screening  - PSA; Future  7. Colon cancer screening - Phone number to call and schedule his colonoscopy   8. Need for tetanus booster  - Tdap vaccine greater than or equal to 7yo IM  9. Need for pneumococcal vaccine  - Pneumococcal conjugate vaccine 20-valent (Prevnar 20)  Ellean Firman, NP

## 2023-12-23 NOTE — Patient Instructions (Addendum)
 It was great seeing you today   We will follow up with you regarding your lab work   Please let me know if you need anything    Please call [facility]  Address: [address], [place], [place] [address] Phone: [phone]

## 2023-12-24 ENCOUNTER — Other Ambulatory Visit: Payer: Self-pay | Admitting: Adult Health

## 2023-12-24 DIAGNOSIS — I10 Essential (primary) hypertension: Secondary | ICD-10-CM

## 2023-12-27 ENCOUNTER — Ambulatory Visit: Payer: Self-pay | Admitting: Adult Health

## 2023-12-29 MED ORDER — METFORMIN HCL ER 500 MG PO TB24: 500.0000 mg | ORAL_TABLET | Freq: Every day | ORAL | 3 refills | Status: AC

## 2023-12-30 NOTE — Telephone Encounter (Unsigned)
 Copied from CRM [id number]. Topic: Clinical - Lab/Test Results >> Dec 28, 2023  1:30 PM Franky GRADE wrote: Reason for CRM: Patient is returning a call from [facility], I advised patient of the result message left by [employer]. Patient understood and agrees to start the MetFormin. Please advise once the prescription has been sent to the pharmacy.

## 2024-02-04 ENCOUNTER — Encounter: Payer: Self-pay | Admitting: Internal Medicine

## 2024-03-26 ENCOUNTER — Other Ambulatory Visit: Payer: Self-pay | Admitting: Adult Health

## 2024-03-27 ENCOUNTER — Encounter: Payer: Self-pay | Admitting: Adult Health

## 2024-03-27 ENCOUNTER — Ambulatory Visit: Admitting: Adult Health

## 2024-03-27 VITALS — BP 132/80 | HR 68 | Temp 98.2°F | Ht 69.5 in | Wt 231.0 lb

## 2024-03-27 DIAGNOSIS — Z7984 Long term (current) use of oral hypoglycemic drugs: Secondary | ICD-10-CM

## 2024-03-27 DIAGNOSIS — I10 Essential (primary) hypertension: Secondary | ICD-10-CM

## 2024-03-27 DIAGNOSIS — E119 Type 2 diabetes mellitus without complications: Secondary | ICD-10-CM

## 2024-03-27 LAB — POCT GLYCOSYLATED HEMOGLOBIN (HGB A1C): Hemoglobin A1C: 6.5 % — AB (ref 4.0–5.6)

## 2024-03-27 NOTE — Progress Notes (Signed)
 "  Subjective:    Patient ID: Willie Johnson, male    DOB: 02/05/1961, 64 y.o.   MRN: [id number]  HPI 64 year old male who  has a past medical history of Allergic rhinitis due to pollen, Diverticulosis, GERD (gastroesophageal reflux disease), HTN (hypertension), Hyperlipidemia, and Type 2 diabetes mellitus without complication, without long-term current use of insulin (HCC) (04/16/2018).   He presents to the office today for follow up regarding DM and HTN   DM type 2 - He was started on Metformin  500 mg ER daily about three months ago. He is tolerating the medication well. He has not been doing much in the way of exercise or eating healthy but plans to start.  Lab Results  Component Value Date   HGBA1C 7.3 (H) 12/23/2023   HGBA1C 6.4 (A) 07/27/2023   HGBA1C 6.5 12/09/2022   Wt Readings from Last 3 Encounters:  03/27/24 231 lb (104.8 kg)  12/23/23 233 lb (105.7 kg)  07/27/23 227 lb (103 kg)    HTN - managed with losartan  100 mg daily and norvasc  2.5 mg daily.  He denies dizziness, lightheadedness, chest pain, or shortness of breath. He has not taken his medication this morning.   BP Readings from Last 3 Encounters:  03/27/24 132/80  12/23/23 122/80  07/27/23 (!) 160/120      Review of Systems See HPI   Past Medical History:  Diagnosis Date   Allergic rhinitis due to pollen    Diverticulosis    GERD (gastroesophageal reflux disease)    HTN (hypertension)    Hyperlipidemia    Type 2 diabetes mellitus without complication, without long-term current use of insulin (HCC) 04/16/2018    Social History   Socioeconomic History   Marital status: Married    Spouse name: Not on file   Number of children: Not on file   Years of education: Not on file   Highest education level: Bachelor's degree (e.g., BA, AB, BS)  Occupational History   Occupation: software  Tobacco Use   Smoking status: Former    Current packs/day: 0.00    Average packs/day: 0.5 packs/day for 20.0 years  (10.0 ttl pk-yrs)    Types: Cigarettes    Start date: 07/01/1997    Quit date: 07/01/2017    Years since quitting: 6.7   Smokeless tobacco: Never   Tobacco comments:    no vapes   Vaping Use   Vaping status: Never Used  Substance and Sexual Activity   Alcohol use: No    Alcohol/week: 0.0 standard drinks of alcohol   Drug use: No   Sexual activity: Yes    Partners: Male  Other Topics Concern   Not on file  Social History Narrative   Works as a engineer, structural for [employer]   Social Drivers of Health   Tobacco Use: Medium Risk (03/27/2024)   Patient History    Smoking Tobacco Use: Former    Smokeless Tobacco Use: Never    Passive Exposure: Not on Actuary Strain: Low Risk (12/19/2023)   Overall Financial Resource Strain (CARDIA)    Difficulty of Paying Living Expenses: Not hard at all  Food Insecurity: No Food Insecurity (12/19/2023)   Epic    Worried About Radiation Protection Practitioner of Food in the Last Year: Never true    Ran Out of Food in the Last Year: Never true  Transportation Needs: No Transportation Needs (12/19/2023)   Epic    Lack of Transportation (Medical): No  Lack of Transportation (Non-Medical): No  Physical Activity: Inactive (12/19/2023)   Exercise Vital Sign    Days of Exercise per Week: 0 days    Minutes of Exercise per Session: Not on file  Stress: Stress Concern Present (12/19/2023)   [employer] of Occupational Health - Occupational Stress Questionnaire    Feeling of Stress: To some extent  Social Connections: Moderately Isolated (12/19/2023)   Social Connection and Isolation Panel    Frequency of Communication with Friends and Family: Once a week    Frequency of Social Gatherings with Friends and Family: Twice a week    Attends Religious Services: Never    Database Administrator or Organizations: No    Attends Engineer, Structural: Not on file    Marital Status: Married  Catering Manager Violence: Not on file   Depression (PHQ2-9): Low Risk (12/23/2023)   Depression (PHQ2-9)    PHQ-2 Score: 0  Alcohol Screen: Not on file  Housing: Low Risk (12/19/2023)   Epic    Unable to Pay for Housing in the Last Year: No    Number of Times Moved in the Last Year: 0    Homeless in the Last Year: No  Utilities: Not on file  Health Literacy: Not on file    Past Surgical History:  Procedure Laterality Date   KNEE SURGERY Left 03/2001   lgament tear   SCAPHOID FRACTURE SURGERY Right 03/2006    Family History  Problem Relation Age of Onset   Arthritis Mother    Hyperlipidemia Mother    Heart disease Mother        Valve problem and CHF   Hypertension Mother    Diabetes Mother    Hyperlipidemia Father    Heart disease Father    Stroke Father        CVA    Pulmonary embolism Sister    Colon cancer Neg Hx    Pancreatic cancer Neg Hx    Stomach cancer Neg Hx    Colon polyps Neg Hx    Rectal cancer Neg Hx     Allergies[1]  Medications Ordered Prior to Encounter[2]  BP 132/80   Pulse 68   Temp 98.2 F (36.8 C) (Oral)   Ht 5' 9.5 (1.765 m)   Wt 231 lb (104.8 kg)   SpO2 96%   BMI 33.62 kg/m       Objective:   Physical Exam Vitals and nursing note reviewed.  Constitutional:      Appearance: Normal appearance. He is obese.  Cardiovascular:     Rate and Rhythm: Normal rate and regular rhythm.     Pulses: Normal pulses.     Heart sounds: Normal heart sounds.  Pulmonary:     Effort: Pulmonary effort is normal.     Breath sounds: Normal breath sounds.  Skin:    General: Skin is warm and dry.  Neurological:     General: No focal deficit present.     Mental Status: He is alert and oriented to person, place, and time.  Psychiatric:        Mood and Affect: Mood normal.        Behavior: Behavior normal.        Thought Content: Thought content normal.        Judgment: Judgment normal.       Assessment & Plan:  1. Diabetes mellitus treated with oral medication (HCC)  (Primary)  - POC HgB A1c- 6.5 - has improved  -  Work on lifestyle modifications and eat healthy  - Follow up in 6 months   2. Essential hypertension - Well controlled. No change in medication   Darleene Shape, NP     [1] No Known Allergies [2]  Current Outpatient Medications on File Prior to Visit  Medication Sig Dispense Refill   amLODipine  (NORVASC ) 2.5 MG tablet TAKE 1 TABLET BY MOUTH DAILY 90 tablet 3   atorvastatin  (LIPITOR) 80 MG tablet Take 1 tablet (80 mg total) by mouth daily. 90 tablet 2   FLUoxetine  (PROZAC ) 40 MG capsule Take 1 capsule (40 mg total) by mouth daily. 90 capsule 3   losartan  (COZAAR ) 100 MG tablet TAKE 1 TABLET BY MOUTH DAILY 90 tablet 3   metFORMIN  (GLUCOPHAGE -XR) 500 MG 24 hr tablet Take 1 tablet (500 mg total) by mouth at bedtime. 90 tablet 3   Multiple Vitamin (MULTIVITAMIN) tablet Take 1 tablet by mouth daily.     No current facility-administered medications on file prior to visit.   "

## 2024-04-12 ENCOUNTER — Encounter: Payer: Self-pay | Admitting: Internal Medicine

## 2024-04-12 ENCOUNTER — Ambulatory Visit

## 2024-04-12 VITALS — Ht 69.0 in | Wt 228.0 lb

## 2024-04-12 DIAGNOSIS — Z1211 Encounter for screening for malignant neoplasm of colon: Secondary | ICD-10-CM

## 2024-04-12 MED ORDER — NA SULFATE-K SULFATE-MG SULF 17.5-3.13-1.6 GM/177ML PO SOLN: 1.0000 | Freq: Once | ORAL | 0 refills | Status: AC

## 2024-04-12 NOTE — Progress Notes (Signed)

## 2024-04-26 ENCOUNTER — Encounter: Admitting: Internal Medicine

## 2024-06-12 ENCOUNTER — Encounter: Admitting: Internal Medicine
